# Patient Record
Sex: Male | Born: 1950
Health system: Southern US, Community
[De-identification: ages and names within clinical notes are randomized; demographics above are authoritative.]

## PROBLEM LIST (undated history)

## (undated) DIAGNOSIS — E785 Hyperlipidemia, unspecified: Secondary | ICD-10-CM

## (undated) DIAGNOSIS — I7121 Aneurysm of the ascending aorta, without rupture: Secondary | ICD-10-CM

## (undated) DIAGNOSIS — H269 Unspecified cataract: Secondary | ICD-10-CM

## (undated) DIAGNOSIS — C801 Malignant (primary) neoplasm, unspecified: Secondary | ICD-10-CM

## (undated) DIAGNOSIS — M1990 Unspecified osteoarthritis, unspecified site: Secondary | ICD-10-CM

## (undated) DIAGNOSIS — I712 Thoracic aortic aneurysm, without rupture: Secondary | ICD-10-CM

## (undated) DIAGNOSIS — I10 Essential (primary) hypertension: Secondary | ICD-10-CM

## (undated) DIAGNOSIS — G4730 Sleep apnea, unspecified: Secondary | ICD-10-CM

## (undated) DIAGNOSIS — I2510 Atherosclerotic heart disease of native coronary artery without angina pectoris: Secondary | ICD-10-CM

## (undated) DIAGNOSIS — Z87442 Personal history of urinary calculi: Secondary | ICD-10-CM

## (undated) DIAGNOSIS — R011 Cardiac murmur, unspecified: Secondary | ICD-10-CM

## (undated) DIAGNOSIS — T7840XA Allergy, unspecified, initial encounter: Secondary | ICD-10-CM

## (undated) HISTORY — PX: OTHER SURGICAL HISTORY: SHX169

## (undated) HISTORY — DX: Allergy, unspecified, initial encounter: T78.40XA

## (undated) HISTORY — PX: JOINT REPLACEMENT: SHX530

## (undated) HISTORY — PX: VASECTOMY: SHX75

## (undated) HISTORY — DX: Unspecified cataract: H26.9

## (undated) HISTORY — PX: KNEE ARTHROSCOPY: SUR90

## (undated) HISTORY — DX: Hyperlipidemia, unspecified: E78.5

## (undated) HISTORY — PX: APPENDECTOMY: SHX54

---

## 2004-04-06 ENCOUNTER — Encounter: Admission: RE | Admit: 2004-04-06 | Discharge: 2004-04-06 | Payer: Self-pay | Admitting: Internal Medicine

## 2005-10-19 ENCOUNTER — Inpatient Hospital Stay (HOSPITAL_COMMUNITY): Admission: RE | Admit: 2005-10-19 | Discharge: 2005-10-22 | Payer: Self-pay | Admitting: Specialist

## 2011-04-12 ENCOUNTER — Other Ambulatory Visit: Payer: Self-pay | Admitting: Pain Medicine

## 2011-06-18 ENCOUNTER — Inpatient Hospital Stay: Admit: 2011-06-18 | Payer: Self-pay | Admitting: Specialist

## 2011-06-18 SURGERY — ARTHROPLASTY, KNEE, TOTAL
Anesthesia: Spinal | Site: Knee | Laterality: Left

## 2011-10-14 ENCOUNTER — Other Ambulatory Visit: Payer: Self-pay | Admitting: Pain Medicine

## 2011-11-29 ENCOUNTER — Other Ambulatory Visit: Payer: Self-pay | Admitting: Pain Medicine

## 2011-11-29 NOTE — H&P (Signed)
TOTAL KNEE ADMISSION H&P  Patient is being admitted for left total knee arthroplasty.  Subjective:  Chief Complaint:left knee pain.  HPI: Nathan Collins, 61 y.o. male, has a history of pain and functional disability in the left knee due to arthritis and has failed non-surgical conservative treatments for greater than 12 weeks to includeNSAID's and/or analgesics, corticosteriod injections, flexibility and strengthening excercises, supervised PT with diminished ADL's post treatment and activity modification.  Onset of symptoms was gradual, starting 6 years ago with rapidlly worsening course since that time. The patient noted prior procedures on the knee to include  arthroscopy on the left knee(s).  Patient currently rates pain in the left knee(s) at 8 out of 10 with activity. Patient has night pain, worsening of pain with activity and weight bearing, pain that interferes with activities of daily living, pain with passive range of motion, crepitus and joint swelling.  Patient has evidence of subchondral cysts, subchondral sclerosis, periarticular osteophytes and joint space narrowing by imaging studies. This patient has had previousarthroscopy and conservative treatments that have not improved his pain. There is no active infection.  PMHx. HTN            Gout  No past medical history on file.  No past surgical history on file.   (Not in a hospital admission) Alleregies- INDOCIN (hives)  Social No tobacco, alcohol 2 a day   Family Hx-  Father D/C heart dz Mother D/c heart dz  Past surg hx Rt TKA, Left knee scope Appendectomy Review of Systems  Constitutional: Negative.   HENT: Negative.   Eyes: Negative.   Respiratory: Negative.   Cardiovascular: Negative.   Gastrointestinal: Negative.   Genitourinary: Negative.   Musculoskeletal: Positive for joint pain.       Left knee pain, difficulty with ADLs  Skin: Negative.   Neurological: Negative.   Endo/Heme/Allergies: Negative.     Psychiatric/Behavioral: Negative.     Objective:  Physical Exam Cons alert appropriate healthy appearing, amb with left sided limp, Head normal, neck full rom ,no lymphadenopathy, lungs clear, heart reg no murmurs, abd soft pos bowel sounds, upper extrem full rom no defects, right leg normal exam with well healed midline incision of knee with full extension and 120 degree flex, left 5-120 degree with varus deformity, calfs soft and non tender, good sense, breast, rectal and GU exams deferred.  Vital signs in last 24 hours: @VSRANGES @  Labs: pending   Vitals- 118/72,80,14. Wt-209, ht 5'7''  BMI 32.7   Imaging Review Plain radiographs demonstrate severe degenerative joint disease of the left knee(s). The overall alignment issignificant varus. The bone quality appears to be good for age and reported activity level.  Assessment/Plan: HTN Gout End stage arthritis, left knee   The patient history, physical examination, clinical judgment of the provider and imaging studies are consistent with end stage degenerative joint disease of the left knee(s) and total knee arthroplasty is deemed medically necessary. The treatment options including medical management, injection therapy arthroscopy and arthroplasty were discussed at length. The risks and benefits of total knee arthroplasty were presented and reviewed. The risks due to aseptic loosening, infection, stiffness, patella tracking problems, thromboembolic complications and other imponderables were discussed. The patient acknowledged the explanation, agreed to proceed with the plan and consent was signed. Patient is being admitted for inpatient treatment for surgery, pain control, PT, OT, prophylactic antibiotics, VTE prophylaxis, progressive ambulation and ADL's and discharge planning. The patient is planning to be discharged home with home health services

## 2011-12-01 ENCOUNTER — Encounter (HOSPITAL_COMMUNITY): Payer: Self-pay | Admitting: Pharmacy Technician

## 2011-12-06 NOTE — Patient Instructions (Signed)
19 Nathan Collins  12/06/2011   Your procedure is scheduled on:  12/10/11 1230pm-245pm  Report to [facility] at 1000 AM.  Call this number if you have problems the morning of surgery: [phone]   Remember:   Do not eat food:After Midnight.  May have clear liquids:until Midnight .  [name]  Take these medicines the morning of surgery with A SIP OF WATER:    Do not wear jewelry,   Do not wear lotions, powders, or perfumes.    Men may shave face and neck.  Do not bring valuables to the hospital.  Contacts, dentures or bridgework may not be worn into surgery.  Leave suitcase in the car. After surgery it may be brought to your room.  For patients admitted to the hospital, checkout time is 11:00 AM the day of discharge.                SEE CHG INSTRUCTION SHEET    Please read over the following fact sheets that you were given: MRSA Information, coughing and deep breathing exercises, leg exercises Incentive Spirometry Fact Sheet, Blood Transfusion Fact Sheet

## 2011-12-07 ENCOUNTER — Encounter (HOSPITAL_COMMUNITY)
Admission: RE | Admit: 2011-12-07 | Discharge: 2011-12-07 | Disposition: A | Discharge status: Home / Self Care | Payer: BC Managed Care – PPO | Source: Ambulatory Visit | Attending: Specialist | Admitting: Specialist

## 2011-12-07 ENCOUNTER — Ambulatory Visit (HOSPITAL_COMMUNITY)
Admission: RE | Admit: 2011-12-07 | Discharge: 2011-12-07 | Disposition: A | Discharge status: Home / Self Care | Payer: BC Managed Care – PPO | Source: Ambulatory Visit | Attending: Specialist | Admitting: Specialist

## 2011-12-07 ENCOUNTER — Encounter (HOSPITAL_COMMUNITY): Payer: Self-pay

## 2011-12-07 DIAGNOSIS — Z01812 Encounter for preprocedural laboratory examination: Secondary | ICD-10-CM | POA: Insufficient documentation

## 2011-12-07 DIAGNOSIS — M479 Spondylosis, unspecified: Secondary | ICD-10-CM | POA: Insufficient documentation

## 2011-12-07 DIAGNOSIS — I10 Essential (primary) hypertension: Secondary | ICD-10-CM | POA: Insufficient documentation

## 2011-12-07 DIAGNOSIS — Z01818 Encounter for other preprocedural examination: Secondary | ICD-10-CM | POA: Insufficient documentation

## 2011-12-07 DIAGNOSIS — Z01810 Encounter for preprocedural cardiovascular examination: Secondary | ICD-10-CM | POA: Insufficient documentation

## 2011-12-07 HISTORY — DX: Unspecified osteoarthritis, unspecified site: M19.90

## 2011-12-07 HISTORY — DX: Malignant (primary) neoplasm, unspecified: C80.1

## 2011-12-07 HISTORY — DX: Sleep apnea, unspecified: G47.30

## 2011-12-07 HISTORY — DX: Essential (primary) hypertension: I10

## 2011-12-07 LAB — COMPREHENSIVE METABOLIC PANEL
BUN: 24 mg/dL — ABNORMAL HIGH (ref 6–23)
Calcium: 9.1 mg/dL (ref 8.4–10.5)
Creatinine, Ser: 0.94 mg/dL (ref 0.50–1.35)
GFR calc Af Amer: >90 mL/min (ref >90)
GFR calc non Af Amer: 88 mL/min — ABNORMAL LOW (ref >90)
Glucose, Bld: 112 mg/dL — ABNORMAL HIGH (ref 70–99)
Sodium: 137 mEq/L (ref 135–145)
Total Protein: 6.8 g/dL (ref 6.0–8.3)

## 2011-12-07 LAB — CBC WITH DIFFERENTIAL/PLATELET
Eosinophils Absolute: 0.1 10*3/uL (ref 0.0–0.7)
Eosinophils Relative: 1 % (ref 0–5)
HCT: 44 % (ref 39.0–52.0)
Lymphs Abs: 1.3 10*3/uL (ref 0.7–4.0)
MCH: 30 pg (ref 26.0–34.0)
MCV: 87.3 fL (ref 78.0–100.0)
Monocytes Absolute: 0.4 10*3/uL (ref 0.1–1.0)
Monocytes Relative: 7 % (ref 3–12)
Platelets: 193 10*3/uL (ref 150–400)
RBC: 5.04 MIL/uL (ref 4.22–5.81)

## 2011-12-07 LAB — URINALYSIS, ROUTINE W REFLEX MICROSCOPIC
Ketones, ur: NEGATIVE mg/dL
Leukocytes, UA: NEGATIVE
Protein, ur: NEGATIVE mg/dL
Urobilinogen, UA: 0.2 mg/dL (ref 0.0–1.0)

## 2011-12-07 LAB — PROTIME-INR
INR: 0.97 (ref 0.00–1.49)
Prothrombin Time: 12.8 seconds (ref 11.6–15.2)

## 2011-12-07 LAB — SURGICAL PCR SCREEN: MRSA, PCR: NEGATIVE

## 2011-12-07 NOTE — Progress Notes (Signed)
Patient stated he will bring prescription drugs from home to take while at hospital due to cost.  Nurse instructed patient as a rule pharmacy will not dispense .  Patient stated he will bring prescription drugs from home in original bottle.

## 2011-12-07 NOTE — Progress Notes (Signed)
Sleep Study Report on chart from 4.29.2009

## 2011-12-07 NOTE — Progress Notes (Signed)
Patient aware to bring CPAP mask and tubing day of surgery.   

## 2011-12-10 ENCOUNTER — Encounter (HOSPITAL_COMMUNITY): Payer: Self-pay | Admitting: *Deleted

## 2011-12-10 ENCOUNTER — Inpatient Hospital Stay (HOSPITAL_COMMUNITY)
Admission: RE | Admit: 2011-12-10 | Discharge: 2011-12-12 | DRG: 209 | Disposition: A | Discharge status: Home Health | Payer: BC Managed Care – PPO | Source: Ambulatory Visit | Attending: Specialist | Admitting: Specialist

## 2011-12-10 ENCOUNTER — Encounter (HOSPITAL_COMMUNITY)
Admission: RE | Disposition: A | Discharge status: Home Health | Payer: Self-pay | Source: Ambulatory Visit | Attending: Specialist

## 2011-12-10 ENCOUNTER — Ambulatory Visit (HOSPITAL_COMMUNITY): Payer: BC Managed Care – PPO | Admitting: Anesthesiology

## 2011-12-10 ENCOUNTER — Encounter (HOSPITAL_COMMUNITY): Payer: Self-pay | Admitting: Anesthesiology

## 2011-12-10 DIAGNOSIS — Z96659 Presence of unspecified artificial knee joint: Secondary | ICD-10-CM

## 2011-12-10 DIAGNOSIS — I10 Essential (primary) hypertension: Secondary | ICD-10-CM | POA: Diagnosis present

## 2011-12-10 DIAGNOSIS — Z85820 Personal history of malignant melanoma of skin: Secondary | ICD-10-CM

## 2011-12-10 DIAGNOSIS — M1710 Unilateral primary osteoarthritis, unspecified knee: Principal | ICD-10-CM | POA: Diagnosis present

## 2011-12-10 DIAGNOSIS — G4730 Sleep apnea, unspecified: Secondary | ICD-10-CM | POA: Diagnosis present

## 2011-12-10 HISTORY — PX: TOTAL KNEE ARTHROPLASTY: SHX125

## 2011-12-10 SURGERY — ARTHROPLASTY, KNEE, TOTAL
Anesthesia: Spinal | Site: Knee | Laterality: Left | Wound class: Clean

## 2011-12-10 MED ORDER — MENTHOL 3 MG MT LOZG
1.0000 | LOZENGE | OROMUCOSAL | Status: DC | PRN
  Filled 2011-12-10: qty 9

## 2011-12-10 MED ORDER — ONDANSETRON HCL 4 MG PO TABS: 4.0000 mg | ORAL_TABLET | Freq: Four times a day (QID) | ORAL | Status: DC | PRN

## 2011-12-10 MED ORDER — LACTATED RINGERS IV SOLN
INTRAVENOUS | Status: DC | PRN
  Administered 2011-12-10 (×2): via INTRAVENOUS

## 2011-12-10 MED ORDER — CEFAZOLIN SODIUM-DEXTROSE 2-3 GM-% IV SOLR
2.0000 g | Freq: Four times a day (QID) | INTRAVENOUS | Status: AC
  Administered 2011-12-10 – 2011-12-11 (×2): 2 g via INTRAVENOUS
  Filled 2011-12-10 (×2): qty 50

## 2011-12-10 MED ORDER — CEFAZOLIN SODIUM-DEXTROSE 2-3 GM-% IV SOLR
INTRAVENOUS | Status: AC
Start: 2011-12-10 — End: 2011-12-10
  Filled 2011-12-10: qty 50

## 2011-12-10 MED ORDER — LOSARTAN POTASSIUM 50 MG PO TABS
100.0000 mg | ORAL_TABLET | Freq: Every morning | ORAL | Status: DC
  Administered 2011-12-11: 100 mg via ORAL
  Filled 2011-12-10 (×2): qty 2

## 2011-12-10 MED ORDER — PHENOL 1.4 % MT LIQD
1.0000 | OROMUCOSAL | Status: DC | PRN
  Filled 2011-12-10: qty 177

## 2011-12-10 MED ORDER — MIDAZOLAM HCL 5 MG/5ML IJ SOLN
INTRAMUSCULAR | Status: DC | PRN
  Administered 2011-12-10 (×2): 1 mg via INTRAVENOUS

## 2011-12-10 MED ORDER — ALUM & MAG HYDROXIDE-SIMETH 200-200-20 MG/5ML PO SUSP: 30.0000 mL | ORAL | Status: DC | PRN

## 2011-12-10 MED ORDER — FENTANYL CITRATE 0.05 MG/ML IJ SOLN
INTRAMUSCULAR | Status: DC | PRN
  Administered 2011-12-10 (×5): 50 ug via INTRAVENOUS

## 2011-12-10 MED ORDER — KETOROLAC TROMETHAMINE 30 MG/ML IJ SOLN
INTRAMUSCULAR | Status: AC
  Filled 2011-12-10: qty 1

## 2011-12-10 MED ORDER — ENOXAPARIN SODIUM 30 MG/0.3ML ~~LOC~~ SOLN
30.0000 mg | Freq: Two times a day (BID) | SUBCUTANEOUS | Status: DC
  Administered 2011-12-11 – 2011-12-12 (×3): 30 mg via SUBCUTANEOUS
  Filled 2011-12-10 (×6): qty 0.3

## 2011-12-10 MED ORDER — METHOCARBAMOL 100 MG/ML IJ SOLN
500.0000 mg | Freq: Four times a day (QID) | INTRAVENOUS | Status: DC | PRN
  Administered 2011-12-10: 500 mg via INTRAVENOUS
  Filled 2011-12-10 (×2): qty 5

## 2011-12-10 MED ORDER — DIPHENHYDRAMINE HCL 12.5 MG/5ML PO ELIX: 12.5000 mg | ORAL_SOLUTION | ORAL | Status: DC | PRN

## 2011-12-10 MED ORDER — PROMETHAZINE HCL 25 MG/ML IJ SOLN: 6.2500 mg | INTRAMUSCULAR | Status: DC | PRN

## 2011-12-10 MED ORDER — OXYCODONE HCL 5 MG PO TABS
5.0000 mg | ORAL_TABLET | ORAL | Status: DC | PRN
  Administered 2011-12-10 – 2011-12-12 (×12): 10 mg via ORAL
  Filled 2011-12-10 (×12): qty 2

## 2011-12-10 MED ORDER — LACTATED RINGERS IV SOLN: INTRAVENOUS | Status: DC

## 2011-12-10 MED ORDER — BISACODYL 10 MG RE SUPP: 10.0000 mg | Freq: Every day | RECTAL | Status: DC | PRN

## 2011-12-10 MED ORDER — ACETAMINOPHEN 325 MG PO TABS: 650.0000 mg | ORAL_TABLET | Freq: Four times a day (QID) | ORAL | Status: DC | PRN

## 2011-12-10 MED ORDER — METOCLOPRAMIDE HCL 10 MG PO TABS: 5.0000 mg | ORAL_TABLET | Freq: Three times a day (TID) | ORAL | Status: DC | PRN

## 2011-12-10 MED ORDER — SODIUM CHLORIDE 0.9 % IR SOLN
Status: DC | PRN
  Administered 2011-12-10: 1000 mL
  Administered 2011-12-10: 3000 mL

## 2011-12-10 MED ORDER — LIDOCAINE HCL (CARDIAC) 20 MG/ML IV SOLN
INTRAVENOUS | Status: DC | PRN
  Administered 2011-12-10: 50 mg via INTRAVENOUS

## 2011-12-10 MED ORDER — FERROUS SULFATE 325 (65 FE) MG PO TABS
325.0000 mg | ORAL_TABLET | Freq: Three times a day (TID) | ORAL | Status: DC
  Administered 2011-12-11 – 2011-12-12 (×4): 325 mg via ORAL
  Filled 2011-12-10 (×8): qty 1

## 2011-12-10 MED ORDER — ACETAMINOPHEN 650 MG RE SUPP: 650.0000 mg | Freq: Four times a day (QID) | RECTAL | Status: DC | PRN

## 2011-12-10 MED ORDER — SODIUM CHLORIDE 0.9 % IV SOLN: INTRAVENOUS | Status: DC

## 2011-12-10 MED ORDER — BUPIVACAINE-EPINEPHRINE 0.25% -1:200000 IJ SOLN
INTRAMUSCULAR | Status: AC
  Filled 2011-12-10: qty 1

## 2011-12-10 MED ORDER — ACETAMINOPHEN 10 MG/ML IV SOLN
INTRAVENOUS | Status: AC
  Filled 2011-12-10: qty 100

## 2011-12-10 MED ORDER — BUPIVACAINE IN DEXTROSE 0.75-8.25 % IT SOLN
INTRATHECAL | Status: DC | PRN
  Administered 2011-12-10: 2 mL via INTRATHECAL

## 2011-12-10 MED ORDER — HYDROMORPHONE HCL PF 1 MG/ML IJ SOLN
INTRAMUSCULAR | Status: AC
  Filled 2011-12-10: qty 1

## 2011-12-10 MED ORDER — BUPIVACAINE-EPINEPHRINE 0.25% -1:200000 IJ SOLN
INTRAMUSCULAR | Status: DC | PRN
  Administered 2011-12-10: 50 mL

## 2011-12-10 MED ORDER — PROPOFOL INFUSION 10 MG/ML OPTIME
INTRAVENOUS | Status: DC | PRN
  Administered 2011-12-10: 50 ug/kg/min via INTRAVENOUS

## 2011-12-10 MED ORDER — ACETAMINOPHEN 10 MG/ML IV SOLN
INTRAVENOUS | Status: DC | PRN
  Administered 2011-12-10: 1000 mg via INTRAVENOUS

## 2011-12-10 MED ORDER — DOCUSATE SODIUM 100 MG PO CAPS
100.0000 mg | ORAL_CAPSULE | Freq: Two times a day (BID) | ORAL | Status: DC
  Administered 2011-12-10 – 2011-12-11 (×3): 100 mg via ORAL

## 2011-12-10 MED ORDER — ALLOPURINOL 100 MG PO TABS
200.0000 mg | ORAL_TABLET | Freq: Every morning | ORAL | Status: DC
  Administered 2011-12-11: 200 mg via ORAL
  Filled 2011-12-10 (×2): qty 2

## 2011-12-10 MED ORDER — ACETAMINOPHEN 10 MG/ML IV SOLN
1000.0000 mg | Freq: Four times a day (QID) | INTRAVENOUS | Status: AC
  Administered 2011-12-10 – 2011-12-11 (×4): 1000 mg via INTRAVENOUS
  Filled 2011-12-10 (×5): qty 100

## 2011-12-10 MED ORDER — ZOLPIDEM TARTRATE 5 MG PO TABS: 5.0000 mg | ORAL_TABLET | Freq: Every evening | ORAL | Status: DC | PRN

## 2011-12-10 MED ORDER — METOCLOPRAMIDE HCL 5 MG/ML IJ SOLN: 5.0000 mg | Freq: Three times a day (TID) | INTRAMUSCULAR | Status: DC | PRN

## 2011-12-10 MED ORDER — POLYETHYLENE GLYCOL 3350 17 G PO PACK: 17.0000 g | PACK | Freq: Every day | ORAL | Status: DC | PRN

## 2011-12-10 MED ORDER — HYDROMORPHONE HCL PF 1 MG/ML IJ SOLN: 0.2500 mg | INTRAMUSCULAR | Status: DC | PRN

## 2011-12-10 MED ORDER — METHOCARBAMOL 500 MG PO TABS
500.0000 mg | ORAL_TABLET | Freq: Four times a day (QID) | ORAL | Status: DC | PRN
  Administered 2011-12-11 – 2011-12-12 (×4): 500 mg via ORAL
  Filled 2011-12-10 (×4): qty 1

## 2011-12-10 MED ORDER — POVIDONE-IODINE 7.5 % EX SOLN: Freq: Once | CUTANEOUS | Status: DC

## 2011-12-10 MED ORDER — CEFAZOLIN SODIUM-DEXTROSE 2-3 GM-% IV SOLR
2.0000 g | INTRAVENOUS | Status: AC
  Administered 2011-12-10: 2 g via INTRAVENOUS

## 2011-12-10 MED ORDER — POTASSIUM CHLORIDE IN NACL 20-0.9 MEQ/L-% IV SOLN
INTRAVENOUS | Status: DC
  Administered 2011-12-10: 21:00:00 via INTRAVENOUS
  Administered 2011-12-11: 1000 mL via INTRAVENOUS
  Filled 2011-12-10 (×4): qty 1000

## 2011-12-10 MED ORDER — ONDANSETRON HCL 4 MG/2ML IJ SOLN: 4.0000 mg | Freq: Four times a day (QID) | INTRAMUSCULAR | Status: DC | PRN

## 2011-12-10 MED ORDER — HYDROMORPHONE HCL PF 1 MG/ML IJ SOLN
0.5000 mg | INTRAMUSCULAR | Status: DC | PRN
  Administered 2011-12-10: 0.5 mg via INTRAVENOUS

## 2011-12-10 MED ORDER — FLEET ENEMA 7-19 GM/118ML RE ENEM: 1.0000 | ENEMA | Freq: Once | RECTAL | Status: AC | PRN

## 2011-12-10 SURGICAL SUPPLY — 70 items
BAG SPEC THK2 15X12 ZIP CLS (MISCELLANEOUS) ×2
BAG ZIPLOCK 12X15 (MISCELLANEOUS) ×4 IMPLANT
BANDAGE ELASTIC 4 VELCRO ST LF (GAUZE/BANDAGES/DRESSINGS) ×2 IMPLANT
BANDAGE ELASTIC 6 VELCRO ST LF (GAUZE/BANDAGES/DRESSINGS) ×2 IMPLANT
BANDAGE ESMARK 6X9 LF (GAUZE/BANDAGES/DRESSINGS) ×1 IMPLANT
BANDAGE GAUZE ELAST BULKY 4 IN (GAUZE/BANDAGES/DRESSINGS) ×3 IMPLANT
BLADE SAG 18X100X1.27 (BLADE) ×2 IMPLANT
BLADE SAW SGTL 13.0X1.19X90.0M (BLADE) ×2 IMPLANT
BNDG CMPR 9X6 STRL LF SNTH (GAUZE/BANDAGES/DRESSINGS) ×1
BNDG ESMARK 6X9 LF (GAUZE/BANDAGES/DRESSINGS) ×2
CEMENT HV SMART SET (Cement) ×4 IMPLANT
CLOTH BEACON ORANGE TIMEOUT ST (SAFETY) ×2 IMPLANT
CUFF TOURN SGL QUICK 34 (TOURNIQUET CUFF) ×2
CUFF TRNQT CYL 34X4X40X1 (TOURNIQUET CUFF) ×1 IMPLANT
DRAPE EXTREMITY T 121X128X90 (DRAPE) ×2 IMPLANT
DRAPE LG THREE QUARTER DISP (DRAPES) ×2 IMPLANT
DRAPE POUCH INSTRU U-SHP 10X18 (DRAPES) ×2 IMPLANT
DRAPE U-SHAPE 47X51 STRL (DRAPES) ×2 IMPLANT
DRSG PAD ABDOMINAL 8X10 ST (GAUZE/BANDAGES/DRESSINGS) ×3 IMPLANT
DURAPREP 26ML APPLICATOR (WOUND CARE) ×2 IMPLANT
ELECT REM PT RETURN 9FT ADLT (ELECTROSURGICAL) ×2
ELECTRODE REM PT RTRN 9FT ADLT (ELECTROSURGICAL) ×1 IMPLANT
EVACUATOR 1/8 PVC DRAIN (DRAIN) ×2 IMPLANT
FACESHIELD LNG OPTICON STERILE (SAFETY) ×10 IMPLANT
GAUZE XEROFORM 2X2 STRL (GAUZE/BANDAGES/DRESSINGS) ×2 IMPLANT
GLOVE BIOGEL PI IND STRL 6.5 (GLOVE) IMPLANT
GLOVE BIOGEL PI INDICATOR 6.5 (GLOVE) ×2
GLOVE ECLIPSE 6.5 STRL STRAW (GLOVE) ×2 IMPLANT
GLOVE ECLIPSE 8.0 STRL XLNG CF (GLOVE) ×2 IMPLANT
GLOVE SURG ORTHO 8.0 STRL STRW (GLOVE) ×2 IMPLANT
GLOVE SURG ORTHO 9.0 STRL STRW (GLOVE) ×2 IMPLANT
GLOVE SURG SS PI 7.5 STRL IVOR (GLOVE) ×3 IMPLANT
GOWN PREVENTION PLUS XLARGE (GOWN DISPOSABLE) ×4 IMPLANT
GOWN STRL NON-REIN LRG LVL3 (GOWN DISPOSABLE) ×3 IMPLANT
GOWN STRL REIN XL XLG (GOWN DISPOSABLE) ×4 IMPLANT
HANDPIECE INTERPULSE COAX TIP (DISPOSABLE) ×2
IMMOBILIZER KNEE 20 (SOFTGOODS) ×4
IMMOBILIZER KNEE 20 THIGH 36 (SOFTGOODS) IMPLANT
KIT BASIN OR (CUSTOM PROCEDURE TRAY) ×2 IMPLANT
NDL SAFETY ECLIPSE 18X1.5 (NEEDLE) IMPLANT
NEEDLE HYPO 18GX1.5 SHARP (NEEDLE) ×2
NS IRRIG 1000ML POUR BTL (IV SOLUTION) ×2 IMPLANT
PACK TOTAL JOINT (CUSTOM PROCEDURE TRAY) ×2 IMPLANT
POSITIONER SURGICAL ARM (MISCELLANEOUS) ×2 IMPLANT
SET HNDPC FAN SPRY TIP SCT (DISPOSABLE) ×1 IMPLANT
SET PAD KNEE POSITIONER (MISCELLANEOUS) ×2 IMPLANT
SPONGE GAUZE 4X4 12PLY (GAUZE/BANDAGES/DRESSINGS) ×2 IMPLANT
SPONGE LAP 18X18 X RAY DECT (DISPOSABLE) ×1 IMPLANT
SPONGE SURGIFOAM ABS GEL 100 (HEMOSTASIS) ×2 IMPLANT
STOCKINETTE 6  STRL (DRAPES) ×1
STOCKINETTE 6 STRL (DRAPES) ×1 IMPLANT
STRIP CLOSURE SKIN 1/2X4 (GAUZE/BANDAGES/DRESSINGS) ×3 IMPLANT
SUCTION FRAZIER 12FR DISP (SUCTIONS) ×2 IMPLANT
SUT BONE WAX W31G (SUTURE) ×2 IMPLANT
SUT MNCRL AB 3-0 PS2 18 (SUTURE) ×2 IMPLANT
SUT VIC AB 0 CT1 27 (SUTURE) ×2
SUT VIC AB 0 CT1 27XBRD ANTBC (SUTURE) ×2 IMPLANT
SUT VIC AB 1 CT1 27 (SUTURE) ×4
SUT VIC AB 1 CT1 27XBRD ANTBC (SUTURE) ×7 IMPLANT
SUT VIC AB 2-0 CT1 27 (SUTURE) ×4
SUT VIC AB 2-0 CT1 TAPERPNT 27 (SUTURE) ×2 IMPLANT
SUT VLOC 180 0 24IN GS25 (SUTURE) ×1 IMPLANT
SYR 50ML LL SCALE MARK (SYRINGE) ×1 IMPLANT
TAPE STRIPS DRAPE STRL (GAUZE/BANDAGES/DRESSINGS) ×2 IMPLANT
TOWEL OR 17X26 10 PK STRL BLUE (TOWEL DISPOSABLE) ×5 IMPLANT
TOWEL OR NON WOVEN STRL DISP B (DISPOSABLE) ×1 IMPLANT
TOWER CARTRIDGE SMART MIX (DISPOSABLE) ×2 IMPLANT
TRAY FOLEY CATH 14FRSI W/METER (CATHETERS) ×2 IMPLANT
WATER STERILE IRR 1500ML POUR (IV SOLUTION) ×2 IMPLANT
WRAP KNEE MAXI GEL POST OP (GAUZE/BANDAGES/DRESSINGS) ×4 IMPLANT

## 2011-12-10 NOTE — H&P (Signed)
TOTAL KNEE ADMISSION H&P  Patient is being admitted for left total knee arthroplasty.  Subjective:  Chief Complaint:left knee pain.  HPI: Nathan Collins, 61 y.o. male, has a history of pain and functional disability in the left knee due to arthritis and has failed non-surgical conservative treatments for greater than 12 weeks to includeNSAID's and/or analgesics, corticosteriod injections, flexibility and strengthening excercises, supervised PT with diminished ADL's post treatment and activity modification.  Onset of symptoms was gradual, starting 6 years ago with rapidlly worsening course since that time. The patient noted prior procedures on the knee to include  arthroscopy on the left knee(s).  Patient currently rates pain in the left knee(s) at 8 out of 10 with activity. Patient has night pain, worsening of pain with activity and weight bearing, pain that interferes with activities of daily living, pain with passive range of motion, crepitus and joint swelling.  Patient has evidence of subchondral cysts, subchondral sclerosis, periarticular osteophytes and joint space narrowing by imaging studies. This patient has had previousarthroscopy and conservative treatments that have not improved his pain. There is no active infection.  PMHx. HTN            Gout  Past Medical History  Diagnosis Date  . Hypertension   . Sleep apnea     CPAP- 4 GOES TO 12 OR 14   . Arthritis   . Cancer     HX OF MALIGNANT MELANOMA SHOULDER     Past Surgical History  Procedure Date  . Knee arthroscopy     LEFT   . Right knee surgery    . Joint replacement     RIGHT KNEE REPLACEMENT   . Appendectomy     Prescriptions prior to admission  Medication Sig Dispense Refill  . allopurinol (ZYLOPRIM) 100 MG tablet Take 200 mg by mouth every morning.      [name] ibuprofen (ADVIL,MOTRIN) 200 MG tablet Take 200 mg by mouth every 6 (six) hours as needed. For pain      . losartan (COZAAR) 100 MG tablet Take 100 mg by mouth  every morning.      [name] acetaminophen (TYLENOL) 500 MG tablet Take 500 mg by mouth every 6 (six) hours as needed. For pain       Alleregies- INDOCIN (hives)  Social No tobacco, alcohol 2 a day   Family Hx-  Father D/C heart dz Mother D/c heart dz  Past surg hx Rt TKA, Left knee scope Appendectomy Review of Systems  Constitutional: Negative.   HENT: Negative.   Eyes: Negative.   Respiratory: Negative.   Cardiovascular: Negative.   Gastrointestinal: Negative.   Genitourinary: Negative.   Musculoskeletal: Positive for joint pain.       Left knee pain, difficulty with ADLs  Skin: Negative.   Neurological: Negative.   Endo/Heme/Allergies: Negative.   Psychiatric/Behavioral: Negative.     Objective:  Physical Exam Cons alert appropriate healthy appearing, amb with left sided limp, Head normal, neck full rom ,no lymphadenopathy, lungs clear, heart reg no murmurs, abd soft pos bowel sounds, upper extrem full rom no defects, right leg normal exam with well healed midline incision of knee with full extension and 120 degree flex, left 5-120 degree with varus deformity, calfs soft and non tender, good sense, breast, rectal and GU exams deferred.  Vital signs in last 24 hours: @VSRANGES @  Labs: pending   Vitals- 118/72,80,14. Wt-209, ht 5'7''  BMI 32.7   Imaging Review Plain radiographs demonstrate severe degenerative joint disease  of the left knee(s). The overall alignment issignificant varus. The bone quality appears to be good for age and reported activity level.  Assessment/Plan: HTN Gout End stage arthritis, left knee   The patient history, physical examination, clinical judgment of the provider and imaging studies are consistent with end stage degenerative joint disease of the left knee(s) and total knee arthroplasty is deemed medically necessary. The treatment options including medical management, injection therapy arthroscopy and arthroplasty were discussed at length. The  risks and benefits of total knee arthroplasty were presented and reviewed. The risks due to aseptic loosening, infection, stiffness, patella tracking problems, thromboembolic complications and other imponderables were discussed. The patient acknowledged the explanation, agreed to proceed with the plan and consent was signed. Patient is being admitted for inpatient treatment for surgery, pain control, PT, OT, prophylactic antibiotics, VTE prophylaxis, progressive ambulation and ADL's and discharge planning. The patient is planning to be discharged home with home health services  I have seen and examined this patient.  Agree with the note above.  Ceri Mayer ANDREW 12/10/2011 1:12 PM

## 2011-12-10 NOTE — Anesthesia Procedure Notes (Signed)
Spinal  Patient location during procedure: OR Start time: 12/10/2011 1:18 PM End time: 12/10/2011 1:23 PM Staffing Anesthesiologist: Lucille Passy F Performed by: anesthesiologist  Preanesthetic Checklist Completed: patient identified, site marked, surgical consent, pre-op evaluation, timeout performed, IV checked, risks and benefits discussed and monitors and equipment checked Spinal Block Patient position: sitting Prep: Betadine Patient monitoring: heart rate, continuous pulse ox and blood pressure Injection technique: single-shot Needle Needle type: Quincke  Needle gauge: 22 G Needle length: 9 cm Additional Notes Expiration date of kit checked and confirmed. Patient tolerated procedure well, without complications. Negative heme/paresthesia Lot [id number] DOE 01/2013

## 2011-12-10 NOTE — Anesthesia Postprocedure Evaluation (Signed)
Anesthesia Post Note  Patient: Nathan Collins  Procedure(s) Performed: Procedure(s) (LRB): TOTAL KNEE ARTHROPLASTY (Left)  Anesthesia type: Spinal  Patient location: PACU  Post pain: Pain level controlled  Post assessment: Post-op Vital signs reviewed  Last Vitals:  Filed Vitals:   12/10/11 1616  BP: 121/83  Pulse:   Temp: 36.5 C  Resp:     Post vital signs: Reviewed  Level of consciousness: sedated  Complications: No apparent anesthesia complications

## 2011-12-10 NOTE — Transfer of Care (Signed)
Immediate Anesthesia Transfer of Care Note  Patient: Nathan Collins  Procedure(s) Performed: Procedure(s) (LRB) with comments: TOTAL KNEE ARTHROPLASTY (Left)  Patient Location: PACU  Anesthesia Type: MAC combined with regional for post-op pain  Level of Consciousness: awake, alert  and oriented  Airway & Oxygen Therapy: Patient Spontanous Breathing and Patient connected to face mask oxygen  Post-op Assessment: Report given to PACU RN and Post -op Vital signs reviewed and stable  Post vital signs: Reviewed and stable  Complications: No apparent anesthesia complications

## 2011-12-10 NOTE — Op Note (Signed)
DATE OF SURGERY:  12/10/2011  TIME: 3:13 PM  PATIENT NAME:  Nathan Collins    AGE: 61 y.o.   PRE-OPERATIVE DIAGNOSIS:  Osteoarthritis of the left knee  POST-OPERATIVE DIAGNOSIS:  Osteoarthritis of the left knee  PROCEDURE:  Procedure(s): TOTAL KNEE ARTHROPLASTY  SURGEON:  Martrice Apt ANDREW  ASSISTANT:  Oneida Alar, PA-C, present and scrubbed throughout the case, critical for assistance with exposure, retraction, instrumentation, and closure.  OPERATIVE IMPLANTS: Depuy PFC Sigma Rotating Platform.  Femur size 4, Tibia size 5, Patella size 38 3-peg oval button, with a 12.5 mm polyethylene insert.   PREOPERATIVE INDICATIONS:   Nathan Collins is a 61 y.o. year old male with end stage bone on bone arthritis of the knee who failed conservative treatment and elected for Total Knee Arthroplasty.   The risks, benefits, and alternatives were discussed at length including but not limited to the risks of infection, bleeding, nerve injury, stiffness, blood clots, the need for revision surgery, cardiopulmonary complications, among others, and they were willing to proceed.  OPERATIVE DESCRIPTION:  The patient was brought to the operative room and placed in a supine position.  Spinal anesthesia was administered.  IV antibiotics were given.  The lower extremity was prepped and draped in the usual sterile fashion.  Time out was performed.  The leg was elevated and exsanguinated and the tourniquet was inflated.  Anterior quadriceps tendon splitting approach was performed.  The patella was retracted and osteophytes were removed.  The anterior horn of the medial and lateral meniscus was removed and cruciate ligaments resected.   The distal femur was opened with the drill and the intramedullary distal femoral cutting jig was utilized, set at 5 degrees resecting 10 mm off the distal femur.  Care was taken to protect the collateral ligaments.  The distal femoral sizing jig was applied, taking care  to avoid notching.  Then the 4-in-1 cutting jig was applied and the anterior and posterior femur was cut, along with the chamfer cuts.    Then the extramedullary tibial cutting jig was utilized making the appropriate cut using the anterior tibial crest as a reference building in appropriate posterior slope.  Care was taken during the cut to protect the medial and collateral ligaments.  The proximal tibia was removed along with the posterior horns of the menisci.   The posterior medial femoral osteophytes and posterior lateral femoral osteophytes were removed.    The flexion gap was then measured and was symmetric with the extension gap, measured at 12.  I completed the distal femoral preparation using the appropriate jig to prepare the box.  The patella was then measured, and cut with the saw.    The proximal tibia sized and prepared accordingly with the reamer and the punch, and then all components were trialed with the trial insert.  The knee was found to have excellent balance and full motion.    The above named components were then cemented into place and all excess cement was removed.  The trial polyethylene component was in place during cementation, and then was exchanged for the real polyethylene component.    The knee was easily taken through a range of motion and the patella tracked well and the knee irrigated copiously and the parapatellar and subcutaneous tissue closed with vicryl, and monocryl with steri strips for the skin.  The arthrotomy was closed at 90 of flexion. The wounds were dressed with sterile gauze and the tourniquet released and the patient was awakened and returned  to the PACU in stable and satisfactory condition.  There were no complications.  Total tourniquet time was 100 minutes.

## 2011-12-10 NOTE — Anesthesia Preprocedure Evaluation (Addendum)
Anesthesia Evaluation  Patient identified by MRN, date of birth, ID band Patient awake    Reviewed: Allergy & Precautions, H&P , NPO status , Patient's Chart, lab work & pertinent test results  Airway Mallampati: II TM Distance: >3 FB Neck ROM: Full    Dental  (+) Teeth Intact and Dental Advisory Given   Pulmonary sleep apnea and Continuous Positive Airway Pressure Ventilation ,  breath sounds clear to auscultation  Pulmonary exam normal       Cardiovascular hypertension, Pt. on medications Rhythm:Regular Rate:Normal     Neuro/Psych negative neurological ROS  negative psych ROS   GI/Hepatic negative GI ROS, Neg liver ROS,   Endo/Other  negative endocrine ROS  Renal/GU negative Renal ROS  negative genitourinary   Musculoskeletal  (+) Arthritis -,   Abdominal   Peds  Hematology negative hematology ROS (+)   Anesthesia Other Findings   Reproductive/Obstetrics negative OB ROS                          Anesthesia Physical Anesthesia Plan  ASA: II  Anesthesia Plan: Spinal   Post-op Pain Management:    Induction:   Airway Management Planned:   Additional Equipment:   Intra-op Plan:   Post-operative Plan:   Informed Consent: I have reviewed the patients History and Physical, chart, labs and discussed the procedure including the risks, benefits and alternatives for the proposed anesthesia with the patient or authorized representative who has indicated his/her understanding and acceptance.   Dental advisory given  Plan Discussed with: CRNA  Anesthesia Plan Comments:         Anesthesia Quick Evaluation

## 2011-12-11 LAB — BASIC METABOLIC PANEL
Chloride: 99 mEq/L (ref 96–112)
GFR calc Af Amer: >90 mL/min (ref >90)
GFR calc non Af Amer: 88 mL/min — ABNORMAL LOW (ref >90)
Potassium: 4.3 mEq/L (ref 3.5–5.1)
Sodium: 136 mEq/L (ref 135–145)

## 2011-12-11 LAB — CBC
HCT: 37.1 % — ABNORMAL LOW (ref 39.0–52.0)
Hemoglobin: 12.8 g/dL — ABNORMAL LOW (ref 13.0–17.0)
MCHC: 34.5 g/dL (ref 30.0–36.0)
RDW: 12.8 % (ref 11.5–15.5)
WBC: 7.9 10*3/uL (ref 4.0–10.5)

## 2011-12-11 NOTE — Evaluation (Signed)
Physical Therapy Evaluation Patient Details Name: MARSHAWN NORMOYLE MRN: [id number] DOB: 06/09/1950 Today's Date: 12/11/2011 Time: 4098-1191 PT Time Calculation (min): 29 min  PT Assessment / Plan / Recommendation Clinical Impression  61 y.o. male POD #1 with L TKA. Pt doing very well with mobility, he ambulated 200' with RW and supervision. Expect excellent progress. HHPT and RW recommended for home. Pt would benefit from acute PT to maximize safety and independence with mobility.     PT Assessment  Patient needs continued PT services    Follow Up Recommendations  Home health PT    Does the patient have the potential to tolerate intense rehabilitation      Barriers to Discharge None      Equipment Recommendations  Rolling walker with 5" wheels    Recommendations for Other Services OT consult   Frequency 7X/week    Precautions / Restrictions Precautions Precautions: Knee Required Braces or Orthoses: Knee Immobilizer - Left Knee Immobilizer - Left: On except when in CPM Restrictions Weight Bearing Restrictions: No   Pertinent Vitals/Pain *5/10 L knee Premedicated, ice applied**      Mobility  Bed Mobility Bed Mobility: Supine to Sit Supine to Sit: 4: Min assist;HOB elevated;With rails Details for Bed Mobility Assistance: some A needed to guide LLE off bed. Transfers Sit to Stand: 4: Min guard;With upper extremity assist;From chair/3-in-1;From bed Stand to Sit: 4: Min guard;With upper extremity assist;With armrests;To chair/3-in-1 Details for Transfer Assistance: min cues for hand placement and LLE management. Ambulation/Gait Ambulation/Gait Assistance: 5: Supervision Ambulation Distance (Feet): 200 Feet Assistive device: Rolling walker Ambulation/Gait Assistance Details: Knee immobilizer on LLE Gait Pattern: Step-to pattern General Gait Details: good sequencing, no LOB, good posture    Shoulder Instructions     Exercises Total Joint Exercises Ankle  Circles/Pumps: AROM;Both;15 reps;Supine Quad Sets: AROM;Left;5 reps;Supine Heel Slides: AAROM;Left;10 reps;Supine   PT Diagnosis: Difficulty walking;Acute pain  PT Problem List: Pain;Decreased mobility;Decreased range of motion PT Treatment Interventions: Functional mobility training;Gait training;Stair training;Therapeutic exercise;Patient/family education   PT Goals Acute Rehab PT Goals PT Goal Formulation: With patient Time For Goal Achievement: 12/14/11 Potential to Achieve Goals: Good Pt will go Supine/Side to Sit: Independently PT Goal: Supine/Side to Sit - Progress: Goal set today Pt will go Sit to Stand: with modified independence PT Goal: Sit to Stand - Progress: Goal set today Pt will Ambulate: >150 feet;with modified independence PT Goal: Ambulate - Progress: Goal set today Pt will Go Up / Down Stairs: 3-5 stairs;with rolling walker;with min assist PT Goal: Up/Down Stairs - Progress: Goal set today Pt will Perform Home Exercise Program: Independently PT Goal: Perform Home Exercise Program - Progress: Goal set today  Visit Information  Assistance Needed: +1    Subjective Data  Subjective: I'm motivated.  Patient Stated Goal: return to prior level of independence   Prior Functioning  Home Living Lives With: Spouse Available Help at Discharge: Family Type of Home: House Home Access: Stairs to enter [employer] of Steps: 3 Entrance Stairs-Rails: None Home Layout: Two level;Able to live on main level with bedroom/bathroom Bathroom Shower/Tub: Tub/shower unit Bathroom Toilet: Handicapped height Home Adaptive Equipment: Shower chair without back;Shower chair with back;Walker - standard;Straight cane;Raised toilet seat with rails Prior Function Level of Independence: Independent Able to Take Stairs?: Yes Driving: Yes Vocation: Full time employment Communication Communication: No difficulties Dominant Hand: Right    Cognition  Overall Cognitive  Status: Appears within functional limits for tasks assessed/performed Arousal/Alertness: Awake/alert Orientation Level: Appears intact for tasks  assessed Behavior During Session: [facility] for tasks performed    Extremity/Trunk Assessment Right Upper Extremity Assessment RUE ROM/Strength/Tone: [facility] for tasks assessed Left Upper Extremity Assessment LUE ROM/Strength/Tone: WFL for tasks assessed Right Lower Extremity Assessment RLE ROM/Strength/Tone: Within functional levels RLE Sensation: WFL - Light Touch RLE Coordination: WFL - gross/fine motor Left Lower Extremity Assessment LLE ROM/Strength/Tone: Deficits;Due to pain LLE ROM/Strength/Tone Deficits: knee flexion AAROM approx 30*, limited by pain LLE Sensation: WFL - Light Touch LLE Coordination: WFL - gross/fine motor Trunk Assessment Trunk Assessment: Normal   Balance Balance Balance Assessed: Yes Static Sitting Balance Static Sitting - Balance Support: Feet supported;No upper extremity supported Static Sitting - Level of Assistance: 7: Independent Static Sitting - Comment/# of Minutes: 2  End of Session PT - End of Session Equipment Utilized During Treatment: Left knee immobilizer Activity Tolerance: Patient tolerated treatment well Patient left: in chair;with call bell/phone within reach;with family/visitor present Nurse Communication: Mobility status  GP     [name] 12/11/2011, 10:08 AM  [phone]

## 2011-12-11 NOTE — Progress Notes (Signed)
Physical Therapy Treatment Patient Details Name: Nathan Collins MRN: [id number] DOB: 07/16/1950 Today's Date: 12/11/2011 Time: 3086-5784 PT Time Calculation (min): 29 min  PT Assessment / Plan / Recommendation Comments on Treatment Session  Excellent progress with mobility, he ambulated 200' with RW.  Also doing well with ther ex. Will plan to do stair training tomorrow.     Follow Up Recommendations  Home health PT     Does the patient have the potential to tolerate intense rehabilitation     Barriers to Discharge None      Equipment Recommendations  Rolling walker with 5" wheels    Recommendations for Other Services OT consult  Frequency 7X/week   Plan Discharge plan remains appropriate;Frequency remains appropriate    Precautions / Restrictions Precautions Precautions: Knee Required Braces or Orthoses: Knee Immobilizer - Left Knee Immobilizer - Left: On except when in CPM Restrictions Weight Bearing Restrictions: No   Pertinent Vitals/Pain **6/10 L knee Premedicated, ice applied*    Mobility  Bed Mobility Bed Mobility: Supine to Sit Supine to Sit: 4: Min assist;HOB elevated;With rails Details for Bed Mobility Assistance: assist for LLE (pt 90%), pt used RLE to support LLE Transfers Sit to Stand: With upper extremity assist;From bed;5: Supervision Stand to Sit: With upper extremity assist;With armrests;To chair/3-in-1;5: Supervision Details for Transfer Assistance: min cues for hand placement and LLE management. Ambulation/Gait Ambulation/Gait Assistance: 5: Supervision Ambulation Distance (Feet): 200 Feet Assistive device: Rolling walker Ambulation/Gait Assistance Details: Knee immobilizer on LLE Gait Pattern: Step-to pattern General Gait Details: good sequencing, no LOB, good posture    Exercises Total Joint Exercises Ankle Circles/Pumps: AROM;Both;15 reps;Supine Quad Sets: AROM;Left;5 reps;Supine Towel Squeeze: AROM;Both;10 reps Short Arc QuadBarbaraann Boys;Left;10 reps Heel Slides: AAROM;Left;10 reps;Supine Hip ABduction/ADduction: AAROM;Left;10 reps Straight Leg Raises: AAROM;Left;10 reps   PT Diagnosis: Difficulty walking;Acute pain  PT Problem List: Pain;Decreased mobility;Decreased range of motion PT Treatment Interventions: Functional mobility training;Gait training;Stair training;Therapeutic exercise;Patient/family education   PT Goals Acute Rehab PT Goals PT Goal Formulation: With patient Time For Goal Achievement: 12/14/11 Potential to Achieve Goals: Good Pt will go Supine/Side to Sit: Independently PT Goal: Supine/Side to Sit - Progress: Progressing toward goal Pt will go Sit to Stand: with modified independence PT Goal: Sit to Stand - Progress: Progressing toward goal Pt will Ambulate: >150 feet;with modified independence;with rolling walker PT Goal: Ambulate - Progress: Progressing toward goal Pt will Go Up / Down Stairs: 3-5 stairs;with rolling walker;with min assist PT Goal: Up/Down Stairs - Progress: Goal set today Pt will Perform Home Exercise Program: Independently PT Goal: Perform Home Exercise Program - Progress: Progressing toward goal  Visit Information  Last PT Received On: 12/11/11 Assistance Needed: +1    Subjective Data  Subjective: I'm tired.  Patient Stated Goal: return to prior level of independence   Cognition  Overall Cognitive Status: Appears within functional limits for tasks assessed/performed Arousal/Alertness: Awake/alert Orientation Level: Appears intact for tasks assessed Behavior During Session: [facility] for tasks performed    Balance  Balance Balance Assessed: Yes Static Sitting Balance Static Sitting - Balance Support: Feet supported;No upper extremity supported Static Sitting - Level of Assistance: 7: Independent Static Sitting - Comment/# of Minutes: 2  End of Session PT - End of Session Equipment Utilized During Treatment: Left knee immobilizer Activity Tolerance: Patient  tolerated treatment well Patient left: in chair;with call bell/phone within reach;with family/visitor present Nurse Communication: Mobility status   GP     Nathan Collins 12/11/2011, 12:57 PM [phone]

## 2011-12-11 NOTE — Care Management Note (Signed)
    Page 1 of 2   12/12/2011     11:09:24 AM   CARE MANAGEMENT NOTE 12/12/2011  Patient:  Nathan Collins, Nathan Collins   Account Number:  [id number]  Date Initiated:  12/11/2011  Documentation initiated by:  [facility]  Subjective/Objective Assessment:   post op surgery.     Action/Plan:   Home with hh services which have been pre-arranged with [facility].  Caregiver is spouse. Pt states he already has DME from previous surgery   Anticipated DC Date:  12/13/2011   Anticipated DC Plan:  HOME W HOME HEALTH SERVICES  In-house referral  NA      DC Planning Services  CM consult      [facility] Choice  HOME HEALTH   Choice offered to / List presented to:  C-1 Patient   DME arranged  NA      DME agency  NA     HH arranged  HH-2 PT      [facility] agency  [facility]   Status of service:  Completed, signed off Medicare Important Message given?  NO (If response is "NO", the following Medicare IM given date fields will be blank) Date Medicare IM given:   Date Additional Medicare IM given:    Discharge Disposition:  HOME W HOME HEALTH SERVICES  Per UR Regulation:  Reviewed for med. necessity/level of care/duration of stay  If discussed at Long Length of Stay Meetings, dates discussed:    Comments:  12/12/2011 Raynelle Bring BSN CCM [phone] Pt discharged today with [facility] services in place. Services will start tomorrow 12/13/2011. Contact info for [facility] given to patient .  12-11-11 11:10am Avie Arenas, RNBSN [phone] Spoke with patient and wife in room.  Prior to surgery at home with wife and independent.  Prior to surgery set up with [facility].  CM will follow for further needs on discharge.

## 2011-12-11 NOTE — Evaluation (Signed)
Occupational Therapy Evaluation Patient Details Name: Nathan Collins MRN: [id number] DOB: 08-Sep-1950 Today's Date: 12/11/2011 Time: 4098-1191 OT Time Calculation (min): 31 min  OT Assessment / Plan / Recommendation Clinical Impression  Pt doing well POD 1 LTKR. Skilled OT recommended to maximize independence with BADLs to supervision level in prep for d/c home.    OT Assessment  Patient needs continued OT Services    Follow Up Recommendations  No OT follow up    Barriers to Discharge      Equipment Recommendations  None recommended by OT    Recommendations for Other Services    Frequency  Min 2X/week    Precautions / Restrictions Precautions Precautions: Knee Required Braces or Orthoses: Knee Immobilizer - Left Knee Immobilizer - Left: On except when in CPM Restrictions Weight Bearing Restrictions: No   Pertinent Vitals/Pain Reported 5/10 pain in L knee when ambulating. Repositioned and cold applied.    ADL  Grooming: Performed;Min guard Where Assessed - Grooming: Supported standing Toilet Transfer: Performed;Min Pension scheme manager Method: Sit to Barista: Raised toilet seat with arms (or 3-in-1 over toilet) Equipment Used: Rolling walker ADL Comments: Pt mobilizing well. Expressed concern about being able to get in and out of tub. Will address next session.    OT Diagnosis: Generalized weakness  OT Problem List: Decreased knowledge of use of DME or AE;Pain;Decreased activity tolerance OT Treatment Interventions: Self-care/ADL training;Therapeutic activities;Patient/family education;DME and/or AE instruction   OT Goals Acute Rehab OT Goals OT Goal Formulation: With patient/family Time For Goal Achievement: 12/18/11 Potential to Achieve Goals: Good ADL Goals Pt Will Perform Grooming: with supervision;Standing at sink ADL Goal: Grooming - Progress: Goal set today Pt Will Perform Lower Body Bathing: with supervision;Sit to stand from  chair;Sit to stand from bed ADL Goal: Lower Body Bathing - Progress: Goal set today Pt Will Perform Lower Body Dressing: with supervision;Sit to stand from chair;Sit to stand from bed ADL Goal: Lower Body Dressing - Progress: Goal set today Pt Will Transfer to Toilet: with supervision;Ambulation;Raised toilet seat with arms ADL Goal: Toilet Transfer - Progress: Goal set today Pt Will Perform Toileting - Clothing Manipulation: with supervision;Sitting on 3-in-1 or toilet;Standing ADL Goal: Toileting - Clothing Manipulation - Progress: Goal set today Pt Will Perform Toileting - Hygiene: with supervision;Sit to stand from 3-in-1/toilet ADL Goal: Toileting - Hygiene - Progress: Goal set today Pt Will Perform Tub/Shower Transfer: Tub transfer;with supervision;Ambulation ADL Goal: Tub/Shower Transfer - Progress: Goal set today  Visit Information  Last OT Received On: 12/11/11 Assistance Needed: +1 PT/OT Co-Evaluation/Treatment: Yes    Subjective Data  Subjective: I'd like to go back to work Patient Stated Goal: Walk   Prior Functioning     Home Living Lives With: Spouse Available Help at Discharge: Family Type of Home: House Home Access: Stairs to enter Secretary/administrator of Steps: 3 Entrance Stairs-Rails: None Home Layout: Two level;Able to live on main level with bedroom/bathroom Bathroom Shower/Tub: Tub/shower unit Bathroom Toilet: Handicapped height Home Adaptive Equipment: Shower chair without back;Shower chair with back;Walker - standard;Straight cane;Raised toilet seat with rails Prior Function Level of Independence: Independent Able to Take Stairs?: Yes Driving: Yes Vocation: Full time employment Communication Communication: No difficulties Dominant Hand: Right         Vision/Perception     Cognition  Overall Cognitive Status: Appears within functional limits for tasks assessed/performed Arousal/Alertness: Awake/alert Orientation Level: Appears intact for  tasks assessed Behavior During Session: [facility] for tasks performed    Extremity/Trunk  Assessment Right Upper Extremity Assessment RUE ROM/Strength/Tone: [facility] for tasks assessed Left Upper Extremity Assessment LUE ROM/Strength/Tone: WFL for tasks assessed     Mobility Bed Mobility Bed Mobility: Supine to Sit Supine to Sit: 4: Min assist;HOB elevated;With rails Details for Bed Mobility Assistance: some A needed to guide LLE off bed. Transfers Transfers: Sit to Stand;Stand to Sit Sit to Stand: 4: Min guard;With upper extremity assist;From chair/3-in-1;From bed Stand to Sit: 4: Min guard;With upper extremity assist;With armrests;To chair/3-in-1 Details for Transfer Assistance: min cues for hand placement and LLE management.     Shoulder Instructions     Exercise     Balance     End of Session OT - End of Session Activity Tolerance: Patient tolerated treatment well Patient left: in chair;with call bell/phone within reach;with family/visitor present  GO     Conor Lata A OTR/L [phone] 12/11/2011, 10:00 AM

## 2011-12-11 NOTE — Progress Notes (Signed)
Nathan Collins  MRN: [id number] DOB/Age: 1951/01/08 61 y.o. Physician: Lynnea Maizes, M.D. 1 Day Post-Op Procedure(s) (LRB): TOTAL KNEE ARTHROPLASTY (Left)  Subjective: Mild to moderate pain, controlled with PO's last night Vital Signs Temp:  [97 F (36.1 C)-98.7 F (37.1 C)] 98.7 F (37.1 C) (10/19 0554) Pulse Rate:  [38-79] 74  (10/19 0554) Resp:  [8-20] 14  (10/19 0554) BP: (113-167)/(58-91) 130/77 mmHg (10/19 0554) SpO2:  [96 %-100 %] 98 % (10/19 0554) FiO2 (%):  [97 %] 97 % (10/18 1630) Weight:  [95.709 kg (211 lb)] 95.709 kg (211 lb) (10/18 1630)  Lab Results  [facility] 12/11/11 0438  WBC 7.9  HGB 12.8*  HCT 37.1*  PLT 191   BMET  Basename 12/11/11 0438  NA 136  K 4.3  CL 99  CO2 26  GLUCOSE 132*  BUN 16  CREATININE 0.94  CALCIUM 8.5   INR  Date Value Range Status  12/07/2011 0.97  0.00 - 1.49 Final     Exam  N/V intact, hemovac D/C'd, dressings dry  Plan Progress per TKA protocol [name] M 12/11/2011, 8:47 AM

## 2011-12-12 DIAGNOSIS — Z96659 Presence of unspecified artificial knee joint: Secondary | ICD-10-CM

## 2011-12-12 LAB — CBC
HCT: 36.8 % — ABNORMAL LOW (ref 39.0–52.0)
Hemoglobin: 12.7 g/dL — ABNORMAL LOW (ref 13.0–17.0)
RBC: 4.18 MIL/uL — ABNORMAL LOW (ref 4.22–5.81)

## 2011-12-12 MED ORDER — DSS 100 MG PO CAPS: 100.0000 mg | ORAL_CAPSULE | Freq: Two times a day (BID) | ORAL | Status: DC

## 2011-12-12 MED ORDER — POLYETHYLENE GLYCOL 3350 17 G PO PACK: 17.0000 g | PACK | Freq: Two times a day (BID) | ORAL | Status: DC

## 2011-12-12 MED ORDER — ASPIRIN EC 325 MG PO TBEC: 325.0000 mg | DELAYED_RELEASE_TABLET | Freq: Two times a day (BID) | ORAL | Status: DC

## 2011-12-12 MED ORDER — METHOCARBAMOL 500 MG PO TABS: 500.0000 mg | ORAL_TABLET | Freq: Four times a day (QID) | ORAL | Status: DC | PRN

## 2011-12-12 MED ORDER — OXYCODONE HCL 5 MG PO TABS: 5.0000 mg | ORAL_TABLET | ORAL | Status: DC | PRN

## 2011-12-12 MED ORDER — FERROUS SULFATE 325 (65 FE) MG PO TABS: 325.0000 mg | ORAL_TABLET | Freq: Three times a day (TID) | ORAL | Status: DC

## 2011-12-12 NOTE — Progress Notes (Signed)
Physical Therapy Treatment Patient Details Name: Nathan Collins MRN: [id number] DOB: 03-26-1950 Today's Date: 12/12/2011 Time: 0850-0907 PT Time Calculation (min): 17 min  PT Assessment / Plan / Recommendation Comments on Treatment Session  Stair training completed. Pt ready to DC home. HHPT recommended.     Follow Up Recommendations  Home health PT     Does the patient have the potential to tolerate intense rehabilitation     Barriers to Discharge        Equipment Recommendations  Rolling walker with 5" wheels    Recommendations for Other Services    Frequency 7X/week   Plan Discharge plan remains appropriate;Frequency remains appropriate    Precautions / Restrictions Precautions Precautions: Knee Required Braces or Orthoses: Knee Immobilizer - Left Knee Immobilizer - Left: On except when in CPM Restrictions Weight Bearing Restrictions: No   Pertinent Vitals/Pain *5-6/10 L knee, ice applied HR 108, RN aware**    Mobility  Bed Mobility Bed Mobility: Not assessed  Transfers: Sit to Stand: With upper extremity assist;6: Modified independent (Device/Increase time);From chair/3-in-1 Stand to Sit: With upper extremity assist;With armrests;To chair/3-in-1;6: Modified independent (Device/Increase time) Ambulation/Gait Ambulation/Gait Assistance: 5: Supervision;6: Modified independent (Device/Increase time) Ambulation Distance (Feet): 35 Feet Assistive device: Rolling walker Gait Pattern: Step-to pattern General Gait Details: good sequencing, no LOB, good posture Stairs: Yes Stairs Assistance: 4: Min assist Stairs Assistance Details (indicate cue type and reason): min A to steady RW, wife assisted Stair Management Technique: No rails;Backwards;Step to pattern;With walker Number of Stairs: 3     Exercises   Straight Leg Raises: AAROM;Left;5 reps Long Arc Quad: AAROM;Left;10 reps;Seated Knee Flexion: AAROM;Left;10 reps;Seated   PT Diagnosis:    PT Problem List:    PT Treatment Interventions:     PT Goals Acute Rehab PT Goals PT Goal Formulation: With patient Time For Goal Achievement: 12/14/11 Potential to Achieve Goals: Good Pt will go Supine/Side to Sit: Independently PT Goal: Supine/Side to Sit - Progress: Met Pt will go Sit to Stand: with modified independence PT Goal: Sit to Stand - Progress: Met Pt will Ambulate: >150 feet;with modified independence;with rolling walker PT Goal: Ambulate - Progress: Met Pt will Go Up / Down Stairs: 3-5 stairs;with rolling walker;with min assist PT Goal: Up/Down Stairs - Progress: Met Pt will Perform Home Exercise Program: Independently PT Goal: Perform Home Exercise Program - Progress: Progressing toward goal  Visit Information  Last PT Received On: 12/12/11 Assistance Needed: +1    Subjective Data  Subjective: I didn't sleep well last night.  Patient Stated Goal: progress with mobility   Cognition  Overall Cognitive Status: Appears within functional limits for tasks assessed/performed Arousal/Alertness: Awake/alert Orientation Level: Appears intact for tasks assessed Behavior During Session: [facility] for tasks performed    Balance     End of Session PT - End of Session Equipment Utilized During Treatment: Left knee immobilizer Activity Tolerance: Patient tolerated treatment well Patient left: in chair;with call bell/phone within reach;with family/visitor present Nurse Communication: Mobility status   GP     [name] 12/12/2011, 10:13 AM [phone]

## 2011-12-12 NOTE — Progress Notes (Signed)
   Subjective: 2 Days Post-Op Procedure(s) (LRB): TOTAL KNEE ARTHROPLASTY (Left)   Patient reports pain as mild, pain well controlled. Doing well it PT. No events throughout the night. Ready for discharge home.  Objective:   VITALS:   Filed Vitals:   12/12/11 0629  BP: 137/78  Pulse: 95  Temp: 99.4 F (37.4 C)   Resp: 16    Neurovascular intact Dorsiflexion/Plantar flexion intact Incision: dressing C/D/I No cellulitis present Compartment soft  LABS  Basename 12/12/11 0506 12/11/11 0438  HGB 12.7* 12.8*  HCT 36.8* 37.1*  WBC 10.0 7.9  PLT 175 191     Basename 12/11/11 0438  NA 136  K 4.3  BUN 16  CREATININE 0.94  GLUCOSE 132*     Assessment/Plan: 2 Days Post-Op Procedure(s) (LRB): TOTAL KNEE ARTHROPLASTY (Left) ACE bandage removed and dressing changed Up with therapy Discharge home with home health Follow up in 2 weeks at [facility]. Follow-up Information    Follow up with OLIN,Sharmila Wrobleski D in 2 weeks.   Contact information:   [facility] [address], [address] [place] [address] [phone]             Anastasio Auerbach. Kalyani Maeda   PAC  12/12/2011, 8:35 AM

## 2011-12-13 ENCOUNTER — Encounter (HOSPITAL_COMMUNITY): Payer: Self-pay | Admitting: Specialist

## 2011-12-15 NOTE — Discharge Summary (Signed)
Physician Discharge Summary  Patient ID: Nathan Collins MRN: [id number] DOB/AGE: [birth year] 61 y.o.  Admit date: 12/10/2011 Discharge date: 12/15/2011  Admission Diagnoses: End-stage osteoarthritis left knee Hypertension Gout  Discharge Diagnoses: Status post left total knee arthroplasty Hypertension stable asymptomatic  Gout asymptomatic   Discharged Condition: good  Hospital Course: *Patient was admitted [facility] under the care of Dr. Valma Cava taken to the or were a left total knee arthroplasty was performed under spinal anesthesia with monitored sedation patient had no complications one Hemovac drain was left in place tourniquet was used patient was transferred to recovery room and orthopedic floor for routine total knee arthroplasty protocol. His vital signs remained stable throughout his postoperative care patient did very well. He should she retract drain was DC'd on postop day #1 IV fluids were cut back. Physical therapy nicely on a daily basis. Dressing was changed his wound was benign his leg was neuromotor vascularly intact. He was decided on postop day #2 from a physical therapy standpoint and orthopedic standpoint that he A. she was ready for discharge to home with outpatient home health physical therapy the patient was willing to go so arrangements were made and he was discharged home with home health physical therapy and 2 week followup appointment) provided with his prescriptions**  Consults: None   Significant Diagnostic Studies: Routine total knee arthroplasty labs  Treatments: Routine total knee arthroplasty protocol  Discharge Exam: Blood pressure 137/78, pulse 95, temperature 99.4 F (37.4 C), temperature source Oral, resp. rate 16, height 5\' 7"  (1.702 m), weight 95.709 kg (211 lb), SpO2 98.00%. Patient was reportedly conscious alert and appropriate appeared to be in no distress his wound was reported to be benign for any signs of infection well  approximated with Steri-Strips his calf and thigh were soft and nontender his leg was neuromotor vascularly intact  Disposition: 06-Home-Health Care Svc  Discharge Orders    Future Orders Please Complete By Expires   Diet - low sodium heart healthy      Call MD / Call 911      Comments:   If you experience chest pain or shortness of breath, CALL 911 and be transported to the hospital emergency room.  If you develope a fever above 101 F, pus (white drainage) or increased drainage or redness at the wound, or calf pain, call your surgeon's office.   Discharge instructions      Comments:   Daily dressing changes with gauze and tape. Keep the area dry and clean until follow up. Follow up in 2 weeks at [facility]. Call with any questions or concerns.   Constipation Prevention      Comments:   Drink plenty of fluids.  Prune juice may be helpful.  You may use a stool softener, such as Colace (over the counter) 100 mg twice a day.  Use MiraLax (over the counter) for constipation as needed.   Increase activity slowly as tolerated      TED hose      Comments:   Use stockings (TED hose) for 2 weeks on both leg(s).  You may remove them at night for sleeping.   Change dressing      Comments:   Change the dressing daily with sterile 4 x 4 inch gauze dressing and tape. Keep the area dry and clean.       Medication List     As of 12/15/2011  7:18 AM    STOP taking these medications  ibuprofen 200 MG tablet   Commonly known as: ADVIL,MOTRIN      TAKE these medications         acetaminophen 500 MG tablet   Commonly known as: TYLENOL   Take 500 mg by mouth every 6 (six) hours as needed. For pain      allopurinol 100 MG tablet   Commonly known as: ZYLOPRIM   Take 200 mg by mouth every morning.      aspirin EC 325 MG tablet   Take 1 tablet (325 mg total) by mouth 2 (two) times daily. X 4 weeks      DSS 100 MG Caps   Take 100 mg by mouth 2 (two) times daily.       ferrous sulfate 325 (65 FE) MG tablet   Take 1 tablet (325 mg total) by mouth 3 (three) times daily after meals.      losartan 100 MG tablet   Commonly known as: COZAAR   Take 100 mg by mouth every morning.      methocarbamol 500 MG tablet   Commonly known as: ROBAXIN   Take 1 tablet (500 mg total) by mouth every 6 (six) hours as needed (muscle spasms).      oxyCODONE 5 MG immediate release tablet   Commonly known as: Oxy IR/ROXICODONE   Take 1-2 tablets (5-10 mg total) by mouth every 4 (four) hours as needed for pain.      polyethylene glycol packet   Commonly known as: MIRALAX / GLYCOLAX   Take 17 g by mouth 2 (two) times daily.           Follow-up Information    Follow up with Erasmo Leventhal, MD. In 2 weeks.   Contact information:   [facility] [address] [place] [address] [phone]          Signed: Jamelle Rushing 12/15/2011, 7:18 AM

## 2015-07-09 DIAGNOSIS — Z85820 Personal history of malignant melanoma of skin: Secondary | ICD-10-CM | POA: Diagnosis not present

## 2015-07-09 DIAGNOSIS — L821 Other seborrheic keratosis: Secondary | ICD-10-CM | POA: Diagnosis not present

## 2015-09-08 DIAGNOSIS — G4733 Obstructive sleep apnea (adult) (pediatric): Secondary | ICD-10-CM | POA: Diagnosis not present

## 2015-11-10 DIAGNOSIS — N529 Male erectile dysfunction, unspecified: Secondary | ICD-10-CM | POA: Diagnosis not present

## 2015-11-10 DIAGNOSIS — R7301 Impaired fasting glucose: Secondary | ICD-10-CM | POA: Diagnosis not present

## 2015-11-10 DIAGNOSIS — I10 Essential (primary) hypertension: Secondary | ICD-10-CM | POA: Diagnosis not present

## 2015-11-10 DIAGNOSIS — Z0000 Encounter for general adult medical examination without abnormal findings: Secondary | ICD-10-CM | POA: Diagnosis not present

## 2015-11-10 DIAGNOSIS — E785 Hyperlipidemia, unspecified: Secondary | ICD-10-CM | POA: Diagnosis not present

## 2015-11-10 DIAGNOSIS — Z23 Encounter for immunization: Secondary | ICD-10-CM | POA: Diagnosis not present

## 2015-11-10 DIAGNOSIS — M109 Gout, unspecified: Secondary | ICD-10-CM | POA: Diagnosis not present

## 2015-11-10 DIAGNOSIS — G4733 Obstructive sleep apnea (adult) (pediatric): Secondary | ICD-10-CM | POA: Diagnosis not present

## 2015-11-17 DIAGNOSIS — M1612 Unilateral primary osteoarthritis, left hip: Secondary | ICD-10-CM | POA: Diagnosis not present

## 2015-11-17 DIAGNOSIS — M5442 Lumbago with sciatica, left side: Secondary | ICD-10-CM | POA: Diagnosis not present

## 2015-11-21 DIAGNOSIS — M109 Gout, unspecified: Secondary | ICD-10-CM | POA: Diagnosis not present

## 2015-11-21 DIAGNOSIS — R7303 Prediabetes: Secondary | ICD-10-CM | POA: Diagnosis not present

## 2015-11-21 DIAGNOSIS — I10 Essential (primary) hypertension: Secondary | ICD-10-CM | POA: Diagnosis not present

## 2015-11-21 DIAGNOSIS — E785 Hyperlipidemia, unspecified: Secondary | ICD-10-CM | POA: Diagnosis not present

## 2015-11-21 DIAGNOSIS — Z125 Encounter for screening for malignant neoplasm of prostate: Secondary | ICD-10-CM | POA: Diagnosis not present

## 2015-12-04 DIAGNOSIS — M5442 Lumbago with sciatica, left side: Secondary | ICD-10-CM | POA: Diagnosis not present

## 2015-12-10 DIAGNOSIS — M5442 Lumbago with sciatica, left side: Secondary | ICD-10-CM | POA: Diagnosis not present

## 2015-12-11 DIAGNOSIS — M5442 Lumbago with sciatica, left side: Secondary | ICD-10-CM | POA: Diagnosis not present

## 2015-12-15 DIAGNOSIS — M5442 Lumbago with sciatica, left side: Secondary | ICD-10-CM | POA: Diagnosis not present

## 2015-12-22 DIAGNOSIS — M5442 Lumbago with sciatica, left side: Secondary | ICD-10-CM | POA: Diagnosis not present

## 2015-12-26 DIAGNOSIS — M5442 Lumbago with sciatica, left side: Secondary | ICD-10-CM | POA: Diagnosis not present

## 2016-01-19 DIAGNOSIS — L821 Other seborrheic keratosis: Secondary | ICD-10-CM | POA: Diagnosis not present

## 2016-01-19 DIAGNOSIS — L814 Other melanin hyperpigmentation: Secondary | ICD-10-CM | POA: Diagnosis not present

## 2016-01-19 DIAGNOSIS — Z85820 Personal history of malignant melanoma of skin: Secondary | ICD-10-CM | POA: Diagnosis not present

## 2016-01-19 DIAGNOSIS — D235 Other benign neoplasm of skin of trunk: Secondary | ICD-10-CM | POA: Diagnosis not present

## 2016-01-19 DIAGNOSIS — D1801 Hemangioma of skin and subcutaneous tissue: Secondary | ICD-10-CM | POA: Diagnosis not present

## 2016-04-08 DIAGNOSIS — M9903 Segmental and somatic dysfunction of lumbar region: Secondary | ICD-10-CM | POA: Diagnosis not present

## 2016-04-08 DIAGNOSIS — M5432 Sciatica, left side: Secondary | ICD-10-CM | POA: Diagnosis not present

## 2016-04-14 DIAGNOSIS — M9903 Segmental and somatic dysfunction of lumbar region: Secondary | ICD-10-CM | POA: Diagnosis not present

## 2016-04-14 DIAGNOSIS — M5432 Sciatica, left side: Secondary | ICD-10-CM | POA: Diagnosis not present

## 2016-04-19 DIAGNOSIS — M5432 Sciatica, left side: Secondary | ICD-10-CM | POA: Diagnosis not present

## 2016-04-19 DIAGNOSIS — M9903 Segmental and somatic dysfunction of lumbar region: Secondary | ICD-10-CM | POA: Diagnosis not present

## 2016-04-26 DIAGNOSIS — H9011 Conductive hearing loss, unilateral, right ear, with unrestricted hearing on the contralateral side: Secondary | ICD-10-CM | POA: Diagnosis not present

## 2016-04-26 DIAGNOSIS — M9903 Segmental and somatic dysfunction of lumbar region: Secondary | ICD-10-CM | POA: Diagnosis not present

## 2016-04-26 DIAGNOSIS — M109 Gout, unspecified: Secondary | ICD-10-CM | POA: Diagnosis not present

## 2016-04-26 DIAGNOSIS — E7800 Pure hypercholesterolemia, unspecified: Secondary | ICD-10-CM | POA: Diagnosis not present

## 2016-04-26 DIAGNOSIS — N529 Male erectile dysfunction, unspecified: Secondary | ICD-10-CM | POA: Diagnosis not present

## 2016-04-26 DIAGNOSIS — M5432 Sciatica, left side: Secondary | ICD-10-CM | POA: Diagnosis not present

## 2016-04-26 DIAGNOSIS — I10 Essential (primary) hypertension: Secondary | ICD-10-CM | POA: Diagnosis not present

## 2016-04-26 DIAGNOSIS — H6121 Impacted cerumen, right ear: Secondary | ICD-10-CM | POA: Diagnosis not present

## 2016-04-26 DIAGNOSIS — G4733 Obstructive sleep apnea (adult) (pediatric): Secondary | ICD-10-CM | POA: Diagnosis not present

## 2016-04-27 DIAGNOSIS — M9903 Segmental and somatic dysfunction of lumbar region: Secondary | ICD-10-CM | POA: Diagnosis not present

## 2016-04-27 DIAGNOSIS — H6061 Unspecified chronic otitis externa, right ear: Secondary | ICD-10-CM | POA: Diagnosis not present

## 2016-04-27 DIAGNOSIS — M5432 Sciatica, left side: Secondary | ICD-10-CM | POA: Diagnosis not present

## 2016-04-27 DIAGNOSIS — H6122 Impacted cerumen, left ear: Secondary | ICD-10-CM | POA: Diagnosis not present

## 2016-05-05 DIAGNOSIS — M5432 Sciatica, left side: Secondary | ICD-10-CM | POA: Diagnosis not present

## 2016-05-05 DIAGNOSIS — H903 Sensorineural hearing loss, bilateral: Secondary | ICD-10-CM | POA: Diagnosis not present

## 2016-05-05 DIAGNOSIS — M9903 Segmental and somatic dysfunction of lumbar region: Secondary | ICD-10-CM | POA: Diagnosis not present

## 2016-05-05 DIAGNOSIS — H6061 Unspecified chronic otitis externa, right ear: Secondary | ICD-10-CM | POA: Diagnosis not present

## 2016-05-05 DIAGNOSIS — H6121 Impacted cerumen, right ear: Secondary | ICD-10-CM | POA: Diagnosis not present

## 2016-05-06 DIAGNOSIS — M5432 Sciatica, left side: Secondary | ICD-10-CM | POA: Diagnosis not present

## 2016-05-06 DIAGNOSIS — M9903 Segmental and somatic dysfunction of lumbar region: Secondary | ICD-10-CM | POA: Diagnosis not present

## 2016-05-10 DIAGNOSIS — M5432 Sciatica, left side: Secondary | ICD-10-CM | POA: Diagnosis not present

## 2016-05-10 DIAGNOSIS — M9903 Segmental and somatic dysfunction of lumbar region: Secondary | ICD-10-CM | POA: Diagnosis not present

## 2016-05-13 DIAGNOSIS — M9903 Segmental and somatic dysfunction of lumbar region: Secondary | ICD-10-CM | POA: Diagnosis not present

## 2016-05-13 DIAGNOSIS — M5432 Sciatica, left side: Secondary | ICD-10-CM | POA: Diagnosis not present

## 2016-05-17 DIAGNOSIS — H906 Mixed conductive and sensorineural hearing loss, bilateral: Secondary | ICD-10-CM | POA: Diagnosis not present

## 2016-05-17 DIAGNOSIS — M9903 Segmental and somatic dysfunction of lumbar region: Secondary | ICD-10-CM | POA: Diagnosis not present

## 2016-05-17 DIAGNOSIS — M5432 Sciatica, left side: Secondary | ICD-10-CM | POA: Diagnosis not present

## 2016-05-20 DIAGNOSIS — M9903 Segmental and somatic dysfunction of lumbar region: Secondary | ICD-10-CM | POA: Diagnosis not present

## 2016-05-20 DIAGNOSIS — M5432 Sciatica, left side: Secondary | ICD-10-CM | POA: Diagnosis not present

## 2016-05-24 DIAGNOSIS — M9903 Segmental and somatic dysfunction of lumbar region: Secondary | ICD-10-CM | POA: Diagnosis not present

## 2016-05-24 DIAGNOSIS — M5432 Sciatica, left side: Secondary | ICD-10-CM | POA: Diagnosis not present

## 2016-06-14 DIAGNOSIS — M9903 Segmental and somatic dysfunction of lumbar region: Secondary | ICD-10-CM | POA: Diagnosis not present

## 2016-06-14 DIAGNOSIS — M5432 Sciatica, left side: Secondary | ICD-10-CM | POA: Diagnosis not present

## 2016-07-26 DIAGNOSIS — L821 Other seborrheic keratosis: Secondary | ICD-10-CM | POA: Diagnosis not present

## 2016-07-26 DIAGNOSIS — D1801 Hemangioma of skin and subcutaneous tissue: Secondary | ICD-10-CM | POA: Diagnosis not present

## 2016-07-26 DIAGNOSIS — Z85820 Personal history of malignant melanoma of skin: Secondary | ICD-10-CM | POA: Diagnosis not present

## 2016-07-26 DIAGNOSIS — D225 Melanocytic nevi of trunk: Secondary | ICD-10-CM | POA: Diagnosis not present

## 2016-09-13 DIAGNOSIS — G4733 Obstructive sleep apnea (adult) (pediatric): Secondary | ICD-10-CM | POA: Diagnosis not present

## 2016-10-29 DIAGNOSIS — H25013 Cortical age-related cataract, bilateral: Secondary | ICD-10-CM | POA: Diagnosis not present

## 2016-10-29 DIAGNOSIS — H35033 Hypertensive retinopathy, bilateral: Secondary | ICD-10-CM | POA: Diagnosis not present

## 2016-10-29 DIAGNOSIS — H2513 Age-related nuclear cataract, bilateral: Secondary | ICD-10-CM | POA: Diagnosis not present

## 2016-10-29 DIAGNOSIS — H43813 Vitreous degeneration, bilateral: Secondary | ICD-10-CM | POA: Diagnosis not present

## 2016-10-29 DIAGNOSIS — H35372 Puckering of macula, left eye: Secondary | ICD-10-CM | POA: Diagnosis not present

## 2016-11-10 DIAGNOSIS — Z0000 Encounter for general adult medical examination without abnormal findings: Secondary | ICD-10-CM | POA: Diagnosis not present

## 2016-11-10 DIAGNOSIS — N529 Male erectile dysfunction, unspecified: Secondary | ICD-10-CM | POA: Diagnosis not present

## 2016-11-10 DIAGNOSIS — Z1211 Encounter for screening for malignant neoplasm of colon: Secondary | ICD-10-CM | POA: Diagnosis not present

## 2016-11-10 DIAGNOSIS — M109 Gout, unspecified: Secondary | ICD-10-CM | POA: Diagnosis not present

## 2016-11-10 DIAGNOSIS — Z125 Encounter for screening for malignant neoplasm of prostate: Secondary | ICD-10-CM | POA: Diagnosis not present

## 2016-11-10 DIAGNOSIS — Z1389 Encounter for screening for other disorder: Secondary | ICD-10-CM | POA: Diagnosis not present

## 2016-11-10 DIAGNOSIS — Z23 Encounter for immunization: Secondary | ICD-10-CM | POA: Diagnosis not present

## 2016-11-10 DIAGNOSIS — E785 Hyperlipidemia, unspecified: Secondary | ICD-10-CM | POA: Diagnosis not present

## 2016-11-10 DIAGNOSIS — G4733 Obstructive sleep apnea (adult) (pediatric): Secondary | ICD-10-CM | POA: Diagnosis not present

## 2016-11-10 DIAGNOSIS — I10 Essential (primary) hypertension: Secondary | ICD-10-CM | POA: Diagnosis not present

## 2016-11-10 LAB — LIPID PANEL
Cholesterol: 223 — AB (ref 0–200)
HDL: 37 (ref 35–70)
LDL CALC: 153
Triglycerides: 165 — AB (ref 40–160)

## 2016-11-14 LAB — BASIC METABOLIC PANEL
BUN: 21 (ref 4–21)
Creatinine: 1.1 (ref 0.6–1.3)
GLUCOSE: 107
Potassium: 4.9 (ref 3.4–5.3)
Sodium: 144 (ref 137–147)

## 2016-11-14 LAB — HEPATIC FUNCTION PANEL
ALT: 24 (ref 10–40)
AST: 20 (ref 14–40)
BILIRUBIN, TOTAL: 0.5

## 2016-11-14 LAB — CBC AND DIFFERENTIAL
HCT: 44 (ref 41–53)
Hemoglobin: 14.9 (ref 13.5–17.5)
Platelets: 177 (ref 150–399)
WBC: 6.3

## 2016-11-16 DIAGNOSIS — H6122 Impacted cerumen, left ear: Secondary | ICD-10-CM | POA: Diagnosis not present

## 2017-03-04 DIAGNOSIS — D126 Benign neoplasm of colon, unspecified: Secondary | ICD-10-CM | POA: Diagnosis not present

## 2017-03-04 DIAGNOSIS — Z1211 Encounter for screening for malignant neoplasm of colon: Secondary | ICD-10-CM | POA: Diagnosis not present

## 2017-03-04 DIAGNOSIS — K635 Polyp of colon: Secondary | ICD-10-CM | POA: Diagnosis not present

## 2017-03-09 DIAGNOSIS — K635 Polyp of colon: Secondary | ICD-10-CM | POA: Diagnosis not present

## 2017-03-09 DIAGNOSIS — Z1211 Encounter for screening for malignant neoplasm of colon: Secondary | ICD-10-CM | POA: Diagnosis not present

## 2017-03-09 DIAGNOSIS — D126 Benign neoplasm of colon, unspecified: Secondary | ICD-10-CM | POA: Diagnosis not present

## 2017-05-09 DIAGNOSIS — E785 Hyperlipidemia, unspecified: Secondary | ICD-10-CM | POA: Diagnosis not present

## 2017-05-09 DIAGNOSIS — M109 Gout, unspecified: Secondary | ICD-10-CM | POA: Diagnosis not present

## 2017-05-09 DIAGNOSIS — I10 Essential (primary) hypertension: Secondary | ICD-10-CM | POA: Diagnosis not present

## 2017-05-09 DIAGNOSIS — R7301 Impaired fasting glucose: Secondary | ICD-10-CM | POA: Diagnosis not present

## 2017-05-09 DIAGNOSIS — G4733 Obstructive sleep apnea (adult) (pediatric): Secondary | ICD-10-CM | POA: Diagnosis not present

## 2017-05-11 LAB — LIPID PANEL
Cholesterol: 153 (ref 0–200)
HDL: 37 (ref 35–70)
LDL Cholesterol: 98
Triglycerides: 87 (ref 40–160)

## 2017-05-11 LAB — BASIC METABOLIC PANEL
BUN: 18 (ref 4–21)
CREATININE: 1.1 (ref <=1.3)
GLUCOSE: 113
POTASSIUM: 4.7 (ref 3.4–5.3)
SODIUM: 143 (ref 137–147)

## 2017-05-11 LAB — HEMOGLOBIN A1C: Hemoglobin A1C: 5.9

## 2017-07-25 DIAGNOSIS — L718 Other rosacea: Secondary | ICD-10-CM | POA: Diagnosis not present

## 2017-07-25 DIAGNOSIS — L821 Other seborrheic keratosis: Secondary | ICD-10-CM | POA: Diagnosis not present

## 2017-07-25 DIAGNOSIS — L814 Other melanin hyperpigmentation: Secondary | ICD-10-CM | POA: Diagnosis not present

## 2017-07-25 DIAGNOSIS — L738 Other specified follicular disorders: Secondary | ICD-10-CM | POA: Diagnosis not present

## 2017-07-25 DIAGNOSIS — D229 Melanocytic nevi, unspecified: Secondary | ICD-10-CM | POA: Diagnosis not present

## 2017-07-25 DIAGNOSIS — Z85820 Personal history of malignant melanoma of skin: Secondary | ICD-10-CM | POA: Diagnosis not present

## 2017-07-25 DIAGNOSIS — D1801 Hemangioma of skin and subcutaneous tissue: Secondary | ICD-10-CM | POA: Diagnosis not present

## 2017-09-12 DIAGNOSIS — G4733 Obstructive sleep apnea (adult) (pediatric): Secondary | ICD-10-CM | POA: Diagnosis not present

## 2017-10-11 ENCOUNTER — Encounter: Payer: Self-pay | Admitting: Family Medicine

## 2017-10-11 ENCOUNTER — Ambulatory Visit (INDEPENDENT_AMBULATORY_CARE_PROVIDER_SITE_OTHER): Payer: Medicare Other | Admitting: Family Medicine

## 2017-10-11 DIAGNOSIS — Z8042 Family history of malignant neoplasm of prostate: Secondary | ICD-10-CM | POA: Diagnosis not present

## 2017-10-11 DIAGNOSIS — G4733 Obstructive sleep apnea (adult) (pediatric): Secondary | ICD-10-CM | POA: Diagnosis not present

## 2017-10-11 DIAGNOSIS — Z9989 Dependence on other enabling machines and devices: Secondary | ICD-10-CM

## 2017-10-11 DIAGNOSIS — J309 Allergic rhinitis, unspecified: Secondary | ICD-10-CM | POA: Diagnosis not present

## 2017-10-11 DIAGNOSIS — E785 Hyperlipidemia, unspecified: Secondary | ICD-10-CM

## 2017-10-11 DIAGNOSIS — Z85820 Personal history of malignant melanoma of skin: Secondary | ICD-10-CM

## 2017-10-11 DIAGNOSIS — I10 Essential (primary) hypertension: Secondary | ICD-10-CM | POA: Diagnosis not present

## 2017-10-11 DIAGNOSIS — M109 Gout, unspecified: Secondary | ICD-10-CM | POA: Diagnosis not present

## 2017-10-11 DIAGNOSIS — R739 Hyperglycemia, unspecified: Secondary | ICD-10-CM | POA: Insufficient documentation

## 2017-10-11 NOTE — Assessment & Plan Note (Signed)
Continue lifestyle modifications. Check lipid panel with next blood draw.  

## 2017-10-11 NOTE — Progress Notes (Addendum)
Subjective:  Nathan Collins is a 67 y.o. male who presents today with a chief complaint of gout and to establish care.   HPI:  Gout, chronic problem, stable Several year history.  Patient had a flareup depending on what he eats.  Takes allopurinol 200 mg daily without reported side effects.  Does not remember his last uric acid level.  No current flares.  Hypertension, chronic problem, stable On losartan 100 mg daily.  Tolerates well without reported side effects.  Obstructive sleep apnea, chronic problem, stable Several year history.  Compliant with CPAP.  History of melanoma, chronic problem Follow-up with dermatology once yearly  Family history prostate cancer Has PSA checked yearly.  Has always been within normal ranges.  Rhinorrhea, chronic problem, stable Started 3 to 4 years ago.  Worse in the morning.  No clear precipitating event.  No treatments tried.  Occasional cough.  HLD/Hyperglycemia, chronic problem Not currently on any medications.  He has been modifying his diet by cutting out carbs and alcohol.  ROS: Per HPI, otherwise a complete review of systems was negative.   PMH:  The following were reviewed and entered/updated in epic: Past Medical History:  Diagnosis Date  . Arthritis   . Cancer (HCC)    HX OF MALIGNANT MELANOMA SHOULDER   . Hypertension   . Sleep apnea    CPAP- 4 GOES TO 12 OR 14    Patient Active Problem List   Diagnosis Date Noted  . Gout 10/11/2017  . Essential hypertension 10/11/2017  . OSA on CPAP 10/11/2017  . History of melanoma 10/11/2017  . Family history of prostate cancer 10/11/2017  . Allergic rhinitis 10/11/2017  . Dyslipidemia 10/11/2017  . Hyperglycemia 10/11/2017  . S/P left TKA 12/12/2011   Past Surgical History:  Procedure Laterality Date  . APPENDECTOMY    . JOINT REPLACEMENT     RIGHT KNEE REPLACEMENT   . KNEE ARTHROSCOPY     LEFT   . RIGHT KNEE SURGERY     . TOTAL KNEE ARTHROPLASTY  12/10/2011   Procedure: TOTAL KNEE ARTHROPLASTY;  Surgeon: [name], MD;  Location: [facility];  Service: Orthopedics;  Laterality: Left;    Family History  Problem Relation Age of Onset  . Diabetes Father   . Prostate cancer Father   . Bladder Cancer Mother   . Peripheral Artery Disease Mother   . Arrhythmia Mother     Medications- reviewed and updated Current Outpatient Medications  Medication Sig Dispense Refill  . allopurinol (ZYLOPRIM) 100 MG tablet Take 200 mg by mouth every morning.    . Coenzyme Q10 (CO Q 10 PO) Take by mouth.    . losartan (COZAAR) 100 MG tablet Take 100 mg by mouth every morning.    . Multiple Vitamin (MULTIVITAMIN) tablet Take 1 tablet by mouth daily.     No current facility-administered medications for this visit.     Allergies-reviewed and updated Allergies  Allergen Reactions  . Indocin [Indomethacin] Hives    No reaction to other NSAIDs.     Social History   Socioeconomic History  . Marital status: Married    Spouse name: Not on file  . Number of children: Not on file  . Years of education: Not on file  . Highest education level: Not on file  Occupational History  . Not on file  Social Needs  . Financial resource strain: Not on file  . Food insecurity:    Worry: Not on file  Inability: Not on file  . Transportation needs:    Medical: Not on file    Non-medical: Not on file  Tobacco Use  . Smoking status: Never Smoker  . Smokeless tobacco: Never Used  Substance and Sexual Activity  . Alcohol use: Not Currently  . Drug use: No  . Sexual activity: Yes    Partners: Female  Lifestyle  . Physical activity:    Days per week: Not on file    Minutes per session: Not on file  . Stress: Not on file  Relationships  . Social connections:    Talks on phone: Not on file    Gets together: Not on file    Attends religious service: Not on file    Active member of club or organization: Not on file    Attends meetings of clubs or organizations:  Not on file    Relationship status: Not on file  Other Topics Concern  . Not on file  Social History Narrative  . Not on file    Objective:  Physical Exam: BP 134/82 (BP Location: Left Arm, Patient Position: Sitting, Cuff Size: Normal)   Pulse 65   Temp 98.5 F (36.9 C) (Oral)   Ht 5\' 8"  (1.727 m)   Wt 216 lb 6.4 oz (98.2 kg)   SpO2 94%   BMI 32.90 kg/m   Gen: NAD, resting comfortably HEENT: TMs clear.  Nasal mucosa boggy and erythematous bilaterally. CV: RRR with soft, 2/6 systolic murmur appreciated Pulm: NWOB, CTAB with no crackles, wheezes, or rhonchi GI: Normal bowel sounds present. Soft, Nontender, Nondistended. MSK: No edema, cyanosis, or clubbing noted Skin: Warm, dry Neuro: Grossly normal, moves all extremities Psych: Normal affect and thought content  Assessment/Plan:  Gout Stable on allopurinol 200 mg daily.  Obtain records.  Will need uric acid level with next blood draw.  Essential hypertension At goal on losartan 100 mg daily.  Continue this.  Obtain prior records.  Check CMET with next blood draw.  OSA on CPAP Stable.  Continue CPAP at night.  History of melanoma Stable.  Follows up with dermatology once yearly.  Family history of prostate cancer Check PSA with next blood draw.  Obtain prior records.  Allergic rhinitis No red flag signs or symptoms.  Recommended over-the-counter allergy medication such as Claritin or Zyrtec.  Also recommended intranasal corticosteroid such as fluticasone.  Discussed reasons to return to care.  Dyslipidemia Continue lifestyle modifications.  Check lipid panel with next blood draw.  Hyperglycemia Encouraged continued lifestyle modifications. Check glucose with next blood draw.   Preventative Healthcare Patient was instructed to return soon for AWV. Health Maintenance Due  Topic Date Due  . Hepatitis C Screening  14-Sep-1950  . INFLUENZA VACCINE  09/22/2017   Algis Greenhouse. Jerline Pain, MD 10/11/2017 10:06 AM

## 2017-10-11 NOTE — Assessment & Plan Note (Signed)
Check PSA with next blood draw.  Obtain prior records.

## 2017-10-11 NOTE — Assessment & Plan Note (Signed)
Stable on allopurinol 200 mg daily.  Obtain records.  Will need uric acid level with next blood draw.

## 2017-10-11 NOTE — Assessment & Plan Note (Signed)
Stable.  Continue CPAP at night.

## 2017-10-11 NOTE — Assessment & Plan Note (Signed)
At goal on losartan 100 mg daily.  Continue this.  Obtain prior records.  Check CMET with next blood draw.

## 2017-10-11 NOTE — Assessment & Plan Note (Signed)
Encouraged continued lifestyle modifications. Check glucose with next blood draw.

## 2017-10-11 NOTE — Assessment & Plan Note (Signed)
Stable.  Follows up with dermatology once yearly.

## 2017-10-11 NOTE — Assessment & Plan Note (Signed)
No red flag signs or symptoms.  Recommended over-the-counter allergy medication such as Claritin or Zyrtec.  Also recommended intranasal corticosteroid such as fluticasone.  Discussed reasons to return to care.

## 2017-10-11 NOTE — Patient Instructions (Signed)
It was very nice to see you today!  No medication changes today.  You can try taking an over the counter allergy med or flonase for your runny nose.   Keep up the good work!  Please come back soon for an annual wellness visit.  Come back to see me in 1 year, or sooner as needed.  Take care, Dr Jerline Pain

## 2017-11-04 DIAGNOSIS — H35372 Puckering of macula, left eye: Secondary | ICD-10-CM | POA: Diagnosis not present

## 2017-11-04 DIAGNOSIS — H35033 Hypertensive retinopathy, bilateral: Secondary | ICD-10-CM | POA: Diagnosis not present

## 2017-11-04 DIAGNOSIS — H43813 Vitreous degeneration, bilateral: Secondary | ICD-10-CM | POA: Diagnosis not present

## 2017-11-04 DIAGNOSIS — H2513 Age-related nuclear cataract, bilateral: Secondary | ICD-10-CM | POA: Diagnosis not present

## 2017-11-04 DIAGNOSIS — H25013 Cortical age-related cataract, bilateral: Secondary | ICD-10-CM | POA: Diagnosis not present

## 2017-12-02 ENCOUNTER — Encounter: Payer: Self-pay | Admitting: Nurse Practitioner

## 2017-12-19 ENCOUNTER — Ambulatory Visit: Payer: Medicare Other | Admitting: Nurse Practitioner

## 2017-12-21 DIAGNOSIS — M9903 Segmental and somatic dysfunction of lumbar region: Secondary | ICD-10-CM | POA: Diagnosis not present

## 2017-12-21 DIAGNOSIS — M5432 Sciatica, left side: Secondary | ICD-10-CM | POA: Diagnosis not present

## 2017-12-22 DIAGNOSIS — M5432 Sciatica, left side: Secondary | ICD-10-CM | POA: Diagnosis not present

## 2017-12-22 DIAGNOSIS — M9903 Segmental and somatic dysfunction of lumbar region: Secondary | ICD-10-CM | POA: Diagnosis not present

## 2017-12-23 ENCOUNTER — Telehealth: Payer: Self-pay | Admitting: Family Medicine

## 2017-12-23 MED ORDER — LOSARTAN POTASSIUM 100 MG PO TABS: 100.0000 mg | ORAL_TABLET | Freq: Every morning | ORAL | 1 refills | Status: DC

## 2017-12-23 MED ORDER — ALLOPURINOL 100 MG PO TABS: 200.0000 mg | ORAL_TABLET | Freq: Every morning | ORAL | 3 refills | Status: DC

## 2017-12-23 NOTE — Telephone Encounter (Signed)
Copied from [place] [phone]. Topic: Quick Communication - Rx Refill/Question >> Dec 23, 2017  3:22 PM Scherrie Gerlach wrote: Medication: allopurinol (ZYLOPRIM) 100 MG tablet  losartan (COZAAR) 100 MG tablet  Atorvastatin 10 mg  pt is new to Dr Jerline Pain and requesting these meds  [facility] [id number], [place] - [place] [phone] (Phone) [phone] (Fax)

## 2017-12-23 NOTE — Telephone Encounter (Signed)
Please review for refills: allopurinol 100 mg, historical medication, no last refill date Losartan 100 mg, historical medication, no last refill date  Dr. Jerline Pain

## 2017-12-23 NOTE — Telephone Encounter (Signed)
See note

## 2017-12-26 ENCOUNTER — Other Ambulatory Visit: Payer: Self-pay

## 2017-12-26 DIAGNOSIS — M5432 Sciatica, left side: Secondary | ICD-10-CM | POA: Diagnosis not present

## 2017-12-26 DIAGNOSIS — M9903 Segmental and somatic dysfunction of lumbar region: Secondary | ICD-10-CM | POA: Diagnosis not present

## 2017-12-26 MED ORDER — ALLOPURINOL 100 MG PO TABS: 200.0000 mg | ORAL_TABLET | Freq: Every morning | ORAL | 0 refills | Status: DC

## 2017-12-26 NOTE — Telephone Encounter (Signed)
Pt. Is requesting a 90 day supply of allopurinol; please advise.

## 2017-12-26 NOTE — Telephone Encounter (Signed)
See note

## 2017-12-26 NOTE — Telephone Encounter (Signed)
Rx sent to pharmacy   

## 2017-12-26 NOTE — Telephone Encounter (Signed)
The patient's allopurinol (ZYLOPRIM) 100 MG tablet medication was sent to [facility] as a 30 day supply, but the patient requested a 90day supply.  [facility] will need a new prescription sent over for the 90day supply.

## 2017-12-27 DIAGNOSIS — M9903 Segmental and somatic dysfunction of lumbar region: Secondary | ICD-10-CM | POA: Diagnosis not present

## 2017-12-27 DIAGNOSIS — M5432 Sciatica, left side: Secondary | ICD-10-CM | POA: Diagnosis not present

## 2017-12-29 ENCOUNTER — Telehealth: Payer: Self-pay | Admitting: Family Medicine

## 2017-12-29 ENCOUNTER — Other Ambulatory Visit: Payer: Self-pay

## 2017-12-29 DIAGNOSIS — M9903 Segmental and somatic dysfunction of lumbar region: Secondary | ICD-10-CM | POA: Diagnosis not present

## 2017-12-29 DIAGNOSIS — M5432 Sciatica, left side: Secondary | ICD-10-CM | POA: Diagnosis not present

## 2017-12-29 MED ORDER — ALLOPURINOL 100 MG PO TABS: 200.0000 mg | ORAL_TABLET | Freq: Every morning | ORAL | 0 refills | Status: DC

## 2017-12-29 NOTE — Telephone Encounter (Signed)
Rx sent to pharmacy   

## 2017-12-29 NOTE — Telephone Encounter (Signed)
MEDICATION: allopurinol (ZYLOPRIM) 100 MG tablet  PHARMACY: [id number] - [place], [place] - [address]   IS THIS A 90 DAY SUPPLY : Yes  IS PATIENT OUT OF MEDICATION: No  IF NOT; HOW MUCH IS LEFT:   LAST APPOINTMENT DATE: @11 /02/2017  NEXT APPOINTMENT DATE:@Visit  date not found  OTHER COMMENTS:  Patient stated that medication refill was supposed to be a 90 day supply but he takes the medication 2x daily so he needs an additional amount of medication.    **Let patient know to contact pharmacy at the end of the day to make sure medication is ready. **  ** Please notify patient to allow 48-72 hours to process**  **Encourage patient to contact the pharmacy for refills or they can request refills through [facility]**

## 2018-01-03 DIAGNOSIS — M5432 Sciatica, left side: Secondary | ICD-10-CM | POA: Diagnosis not present

## 2018-01-03 DIAGNOSIS — M9903 Segmental and somatic dysfunction of lumbar region: Secondary | ICD-10-CM | POA: Diagnosis not present

## 2018-01-04 DIAGNOSIS — M9903 Segmental and somatic dysfunction of lumbar region: Secondary | ICD-10-CM | POA: Diagnosis not present

## 2018-01-04 DIAGNOSIS — M5432 Sciatica, left side: Secondary | ICD-10-CM | POA: Diagnosis not present

## 2018-01-05 DIAGNOSIS — M5432 Sciatica, left side: Secondary | ICD-10-CM | POA: Diagnosis not present

## 2018-01-05 DIAGNOSIS — M9903 Segmental and somatic dysfunction of lumbar region: Secondary | ICD-10-CM | POA: Diagnosis not present

## 2018-01-06 DIAGNOSIS — M545 Low back pain: Secondary | ICD-10-CM | POA: Diagnosis not present

## 2018-01-06 DIAGNOSIS — M5416 Radiculopathy, lumbar region: Secondary | ICD-10-CM | POA: Diagnosis not present

## 2018-01-11 DIAGNOSIS — M9903 Segmental and somatic dysfunction of lumbar region: Secondary | ICD-10-CM | POA: Diagnosis not present

## 2018-01-11 DIAGNOSIS — M5432 Sciatica, left side: Secondary | ICD-10-CM | POA: Diagnosis not present

## 2018-01-12 DIAGNOSIS — M5432 Sciatica, left side: Secondary | ICD-10-CM | POA: Diagnosis not present

## 2018-01-12 DIAGNOSIS — M9903 Segmental and somatic dysfunction of lumbar region: Secondary | ICD-10-CM | POA: Diagnosis not present

## 2018-01-16 ENCOUNTER — Encounter (INDEPENDENT_AMBULATORY_CARE_PROVIDER_SITE_OTHER): Payer: Self-pay

## 2018-01-16 ENCOUNTER — Encounter: Payer: Self-pay | Admitting: Nurse Practitioner

## 2018-01-16 ENCOUNTER — Ambulatory Visit (INDEPENDENT_AMBULATORY_CARE_PROVIDER_SITE_OTHER): Payer: Medicare Other | Admitting: Nurse Practitioner

## 2018-01-16 VITALS — BP 120/80 | HR 76 | Ht 67.0 in | Wt 209.0 lb

## 2018-01-16 DIAGNOSIS — I10 Essential (primary) hypertension: Secondary | ICD-10-CM

## 2018-01-16 DIAGNOSIS — Z8249 Family history of ischemic heart disease and other diseases of the circulatory system: Secondary | ICD-10-CM | POA: Diagnosis not present

## 2018-01-16 DIAGNOSIS — E7849 Other hyperlipidemia: Secondary | ICD-10-CM | POA: Diagnosis not present

## 2018-01-16 DIAGNOSIS — G4733 Obstructive sleep apnea (adult) (pediatric): Secondary | ICD-10-CM

## 2018-01-16 DIAGNOSIS — R011 Cardiac murmur, unspecified: Secondary | ICD-10-CM | POA: Diagnosis not present

## 2018-01-16 DIAGNOSIS — R9431 Abnormal electrocardiogram [ECG] [EKG]: Secondary | ICD-10-CM | POA: Diagnosis not present

## 2018-01-16 LAB — HEPATIC FUNCTION PANEL
ALT: 34 IU/L (ref 0–44)
AST: 19 IU/L (ref 0–40)
Albumin: 4.7 g/dL (ref 3.6–4.8)
Alkaline Phosphatase: 84 IU/L (ref 39–117)
Bilirubin Total: 0.4 mg/dL (ref 0.0–1.2)
Bilirubin, Direct: 0.11 mg/dL (ref 0.00–0.40)
Total Protein: 6.7 g/dL (ref 6.0–8.5)

## 2018-01-16 LAB — LIPID PANEL
Chol/HDL Ratio: 4 ratio (ref 0.0–5.0)
Cholesterol, Total: 177 mg/dL (ref 100–199)
HDL: 44 mg/dL (ref >39)
LDL Calculated: 100 mg/dL — ABNORMAL HIGH (ref 0–99)
Triglycerides: 163 mg/dL — ABNORMAL HIGH (ref 0–149)
VLDL Cholesterol Cal: 33 mg/dL (ref 5–40)

## 2018-01-16 LAB — BASIC METABOLIC PANEL
BUN/Creatinine Ratio: 20 (ref 10–24)
BUN: 27 mg/dL (ref 8–27)
CO2: 23 mmol/L (ref 20–29)
Calcium: 9.6 mg/dL (ref 8.6–10.2)
Chloride: 98 mmol/L (ref 96–106)
Creatinine, Ser: 1.34 mg/dL — ABNORMAL HIGH (ref 0.76–1.27)
GFR calc Af Amer: 63 mL/min/{1.73_m2} (ref >59)
GFR calc non Af Amer: 54 mL/min/{1.73_m2} — ABNORMAL LOW (ref >59)
Glucose: 104 mg/dL — ABNORMAL HIGH (ref 65–99)
Potassium: 4.3 mmol/L (ref 3.5–5.2)
Sodium: 139 mmol/L (ref 134–144)

## 2018-01-16 MED ORDER — METOPROLOL TARTRATE 100 MG PO TABS: 100.0000 mg | ORAL_TABLET | ORAL | 0 refills | Status: DC

## 2018-01-16 NOTE — Progress Notes (Signed)
CARDIOLOGY OFFICE NOTE  Date:  01/16/2018    [name] Date of Birth: 1950/05/16 Medical Record #[id number]  PCP:  Vivi Barrack, MD  Cardiologist:  Servando Snare   Chief Complaint  Patient presents with  . Hypertension  . Hyperlipidemia    New patient visit    History of Present Illness: Nathan Collins is a 67 y.o. male who presents today for a new patient visit. I see his wife - [name]. Dr. Radford Pax is the DOD today.   He has a history of HTN, gout, HLD, OSA and history of melanoma.   Comes in today. Here alone. His wife wanted him checked due to his risk factors and +FH for CAD. He denies chest pain. Not short of breath. No syncope.  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 He has an acute episode of sciatica - on Prednisone. He is now taking statin - apparently past lipids pretty high. Both parents developed heart disease in their 55's - dad had CHF/MI/multiple stents. Mom with PAD/CHF - she smoked. He has never smoked. BP ok on current antihypertensive regimen. He is compliant with his CPAP. He was told he had a murmur.   Past Medical History:  Diagnosis Date  . Arthritis   . Cancer (HCC)    HX OF MALIGNANT MELANOMA SHOULDER   . Hypertension   . Sleep apnea    CPAP- 4 GOES TO 12 OR 14     Past Surgical History:  Procedure Laterality Date  . APPENDECTOMY    . JOINT REPLACEMENT     RIGHT KNEE REPLACEMENT   . KNEE ARTHROSCOPY     LEFT   . RIGHT KNEE SURGERY     . TOTAL KNEE ARTHROPLASTY  12/10/2011   Procedure: TOTAL KNEE ARTHROPLASTY;  Surgeon: [name], MD;  Location: [facility];  Service: Orthopedics;  Laterality: Left;     Medications: Current Meds  Medication Sig  . allopurinol (ZYLOPRIM) 100 MG tablet Take 2 tablets (200 mg total) by mouth every morning.  [name] atorvastatin (LIPITOR) 10 MG tablet Take 10 mg by mouth daily at 6 PM.   . Coenzyme Q10 (CO Q 10 PO) Take by mouth.  . losartan (COZAAR) 100 MG tablet  Take 1 tablet (100 mg total) by mouth every morning.  . Multiple Vitamin (MULTIVITAMIN) tablet Take 1 tablet by mouth daily.  . predniSONE (DELTASONE) 20 MG tablet Take 20 mg by mouth daily with breakfast.      Allergies: Allergies  Allergen Reactions  . Indocin [Indomethacin] Hives    No reaction to other NSAIDs.     Social History: The patient  reports that he has never smoked. He has never used smokeless tobacco. He reports that he drank alcohol. He reports that he does not use drugs.   Family History: The patient's family history includes Arrhythmia in his mother; Bladder Cancer in his mother; Diabetes in his father; Heart failure in his father and mother; Hyperlipidemia in his mother; Peripheral Artery Disease in his mother; Prostate cancer in his father.   Review of Systems: Please see the history of present illness.   Otherwise, the review of systems is positive for none.   All other systems are reviewed and negative.   Physical Exam: VS:  BP 120/80 (BP Location: Left Arm, Patient Position: Sitting, Cuff Size: Normal)   Pulse 76   Ht 5\' 7"  (1.702 m)   Wt 209 lb (94.8  kg)   BMI 32.73 kg/m  .  BMI Body mass index is 32.73 kg/m.  Wt Readings from Last 3 Encounters:  01/16/18 209 lb (94.8 kg)  10/11/17 216 lb 6.4 oz (98.2 kg)  12/10/11 211 lb (95.7 kg)    General: Pleasant. Alert - he is having pain down his right leg from his sciatica but he is no acute distress.   HEENT: Normal.  Neck: Supple, no JVD, carotid bruits, or masses noted.  Cardiac: Regular rate and rhythm. Soft systolic murmur noted. No edema.  Respiratory:  Lungs are clear to auscultation bilaterally with normal work of breathing.  GI: Soft and nontender.  MS: No deformity or atrophy. Gait and ROM intact.  Skin: Warm and dry. Color is normal.  Neuro:  Strength and sensation are intact and no gross focal deficits noted.  Psych: Alert, appropriate and with normal affect.   LABORATORY DATA:  EKG:  EKG  is ordered today. This demonstrates NSR - he has inferior Q's noted.  Lab Results  Component Value Date   WBC 6.3 11/14/2016   HGB 14.9 11/14/2016   HCT 44 11/14/2016   PLT 177 11/14/2016   GLUCOSE 132 (H) 12/11/2011   CHOL 153 05/11/2017   TRIG 87 05/11/2017   HDL 37 05/11/2017   LDLCALC 98 05/11/2017   ALT 24 11/14/2016   AST 20 11/14/2016   NA 143 05/11/2017   K 4.7 05/11/2017   CL 99 12/11/2011   CREATININE 1.1 05/11/2017   BUN 18 05/11/2017   CO2 26 12/11/2011   INR 0.97 12/07/2011   HGBA1C 5.9 05/11/2017     BNP (last 3 results) No results for input(s): BNP in the last 8760 hours.  ProBNP (last 3 results) No results for input(s): PROBNP in the last 8760 hours.   Other Studies Reviewed Today:   Assessment/Plan:  1. Multiple CV risk factors (+FH/HTN/HLD) with abnormal EKG & murmur on exam - will arrange for echocardiogram to look at wall motion/diastolic dysfunction/LVH and arrange coronary CT - he is not able to perform GXT due to his sciatica but he says he can lay flat for the CT. Further disposition to follow.   2. HTN - BP ok on current regimen.   3. HLD - now on statin - he is fasting today - will recheck his labs today.   4. OSA - using CPAP  5. Obesity  Current medicines are reviewed with the patient today.  The patient does not have concerns regarding medicines other than what has been noted above.  The following changes have been made:  See above.  Labs/ tests ordered today include:    Orders Placed This Encounter  Procedures  . EKG 12-Lead     Disposition:   FU with me tentatively in one year unless his studies are abnormal.   Patient is agreeable to this plan and will call if any problems develop in the interim.   SignedTruitt Merle, NP  01/16/2018 9:27 AM  [facility] [address] [place], [place]  [address] Phone: [phone] Fax: [phone]

## 2018-01-16 NOTE — Patient Instructions (Addendum)
We will be checking the following labs today - BMET, HPF and lipids  If you have labs (blood work) drawn today and your tests are completely normal, you will receive your results only by: [name] MyChart Message (if you have MyChart) OR . A paper copy in the mail If you have any lab test that is abnormal or we need to change your treatment, we will call you to review the results.   Medication Instructions:    Continue with your current medicines.    If you need a refill on your cardiac medications before your next appointment, please call your pharmacy.     Testing/Procedures To Be Arranged:  Echocardiogram  Coronary CT  Follow-Up:   See me tentatively in one year - unless your studies are abnormal    At [facility], you and your health needs are our priority.  As part of our continuing mission to provide you with exceptional heart care, we have created designated Provider Care Teams.  These Care Teams include your primary Cardiologist (physician) and Advanced Practice Providers (APPs -  Physician Assistants and Nurse Practitioners) who all work together to provide you with the care you need, when you need it.  Special Instructions:  Please arrive at the [facility] main entrance of [facility] at _____________________________________________AM (30-45 minutes prior to test start time)  [facility] [place], [place] [address] [phone]  Proceed to the [facility] Radiology Department (First Floor).  Please follow these instructions carefully (unless otherwise directed):  Hold all erectile dysfunction medications at least 48 hours prior to test.  On the Night Before the Test: Be sure to Drink plenty of water. Do not consume any caffeinated/decaffeinated beverages or chocolate 12 hours prior to your test. Do not take any antihistamines 12 hours prior to your test.   On the Day of the Test: Drink plenty of water. Do not drink any  water within one hour of the test. Do not eat any food 4 hours prior to the test. You may take your regular medications prior to the test.  Take metoprolol (Lopressor) 100 mg (one time dose) two hours prior to test.  After the Test: Drink plenty of water. After receiving IV contrast, you may experience a mild flushed feeling. This is normal. On occasion, you may experience a mild rash up to 24 hours after the test. This is not dangerous. If this occurs, you can take Benadryl 25 mg and increase your fluid intake. If you experience trouble breathing, this can be serious. If it is severe call 911 IMMEDIATELY. If it is mild, please call our office.   Call the [place] office at [phone] if you have any questions, problems or concerns.

## 2018-01-17 ENCOUNTER — Other Ambulatory Visit: Payer: Self-pay

## 2018-01-17 ENCOUNTER — Ambulatory Visit (HOSPITAL_COMMUNITY): Payer: Medicare Other | Attending: Cardiovascular Disease

## 2018-01-17 DIAGNOSIS — Z8249 Family history of ischemic heart disease and other diseases of the circulatory system: Secondary | ICD-10-CM | POA: Insufficient documentation

## 2018-01-17 DIAGNOSIS — I10 Essential (primary) hypertension: Secondary | ICD-10-CM | POA: Diagnosis not present

## 2018-01-17 DIAGNOSIS — E7849 Other hyperlipidemia: Secondary | ICD-10-CM | POA: Insufficient documentation

## 2018-01-17 DIAGNOSIS — R9431 Abnormal electrocardiogram [ECG] [EKG]: Secondary | ICD-10-CM | POA: Diagnosis not present

## 2018-01-17 DIAGNOSIS — R011 Cardiac murmur, unspecified: Secondary | ICD-10-CM | POA: Diagnosis not present

## 2018-01-18 ENCOUNTER — Telehealth: Payer: Self-pay | Admitting: Nurse Practitioner

## 2018-01-18 NOTE — Telephone Encounter (Signed)
Follow up: ° ° °Patient returning call back °

## 2018-01-23 ENCOUNTER — Other Ambulatory Visit: Payer: Self-pay | Admitting: Chiropractic Medicine

## 2018-01-23 DIAGNOSIS — M5416 Radiculopathy, lumbar region: Secondary | ICD-10-CM

## 2018-01-31 DIAGNOSIS — M5136 Other intervertebral disc degeneration, lumbar region: Secondary | ICD-10-CM | POA: Diagnosis not present

## 2018-02-01 ENCOUNTER — Ambulatory Visit
Admission: RE | Admit: 2018-02-01 | Discharge: 2018-02-01 | Disposition: A | Discharge status: Home / Self Care | Payer: Medicare Other | Source: Ambulatory Visit | Attending: Chiropractic Medicine | Admitting: Chiropractic Medicine

## 2018-02-01 DIAGNOSIS — M48061 Spinal stenosis, lumbar region without neurogenic claudication: Secondary | ICD-10-CM | POA: Diagnosis not present

## 2018-02-01 DIAGNOSIS — M5416 Radiculopathy, lumbar region: Secondary | ICD-10-CM

## 2018-02-20 ENCOUNTER — Ambulatory Visit (HOSPITAL_COMMUNITY)
Admission: RE | Admit: 2018-02-20 | Discharge: 2018-02-20 | Disposition: A | Discharge status: Home / Self Care | Payer: Medicare Other | Source: Ambulatory Visit | Attending: Nurse Practitioner | Admitting: Nurse Practitioner

## 2018-02-20 ENCOUNTER — Other Ambulatory Visit: Payer: Self-pay | Admitting: Neurological Surgery

## 2018-02-20 DIAGNOSIS — E7849 Other hyperlipidemia: Secondary | ICD-10-CM | POA: Insufficient documentation

## 2018-02-20 DIAGNOSIS — I10 Essential (primary) hypertension: Secondary | ICD-10-CM | POA: Insufficient documentation

## 2018-02-20 DIAGNOSIS — R9431 Abnormal electrocardiogram [ECG] [EKG]: Secondary | ICD-10-CM

## 2018-02-20 DIAGNOSIS — Z8249 Family history of ischemic heart disease and other diseases of the circulatory system: Secondary | ICD-10-CM

## 2018-02-20 DIAGNOSIS — Z006 Encounter for examination for normal comparison and control in clinical research program: Secondary | ICD-10-CM

## 2018-02-20 DIAGNOSIS — M48061 Spinal stenosis, lumbar region without neurogenic claudication: Secondary | ICD-10-CM | POA: Diagnosis not present

## 2018-02-20 MED ORDER — METOPROLOL TARTRATE 5 MG/5ML IV SOLN
5.0000 mg | INTRAVENOUS | Status: DC | PRN
  Administered 2018-02-20: 5 mg via INTRAVENOUS
  Filled 2018-02-20 (×2): qty 5

## 2018-02-20 MED ORDER — METOPROLOL TARTRATE 5 MG/5ML IV SOLN
INTRAVENOUS | Status: AC
  Filled 2018-02-20: qty 15

## 2018-02-20 MED ORDER — NITROGLYCERIN 0.4 MG SL SUBL
0.8000 mg | SUBLINGUAL_TABLET | Freq: Once | SUBLINGUAL | Status: AC
  Administered 2018-02-20: 0.8 mg via SUBLINGUAL
  Filled 2018-02-20: qty 25

## 2018-02-20 MED ORDER — NITROGLYCERIN 0.4 MG SL SUBL
SUBLINGUAL_TABLET | SUBLINGUAL | Status: AC
  Filled 2018-02-20: qty 2

## 2018-02-20 MED ORDER — IOHEXOL 300 MG/ML  SOLN
100.0000 mL | Freq: Once | INTRAMUSCULAR | Status: AC | PRN
  Administered 2018-02-20: 100 mL via INTRAVENOUS

## 2018-02-20 MED ORDER — NITROGLYCERIN 0.4 MG SL SUBL
SUBLINGUAL_TABLET | SUBLINGUAL | Status: AC
  Filled 2018-02-20: qty 1

## 2018-02-20 NOTE — Research (Signed)
Nathan Collins met inclusion and exclusion criteria.  The informed consent form, study requirements and expectations were reviewed with the subject and questions and concerns were addressed prior to the signing of the consent form.  The subject verbalized understanding of the trial requirements.  The subject agreed to participate in the CADFEM trial and signed the informed consent.  The informed consent was obtained prior to performance of any protocol-specific procedures for the subject.  A copy of the signed informed consent was given to the subject and a copy was placed in the subject's medical record.   Dionne Bucy. [employer]

## 2018-02-20 NOTE — Progress Notes (Signed)
Nathan Collins met inclusion and exclusion criteria.  The informed consent form, study requirements and expectations were reviewed with the subject and questions and concerns were addressed prior to the signing of the consent form.  The subject verbalized understanding of the trial requirements.  The subject agreed to participate in the CADFEM trial and signed the informed consent.  The informed consent was obtained prior to performance of any protocol-specific procedures for the subject.  A copy of the signed informed consent was given to the subject and a copy was placed in the subject's medical record.   Dionne Bucy. [employer]

## 2018-02-23 MED ORDER — ATORVASTATIN CALCIUM 10 MG PO TABS: 10.0000 mg | ORAL_TABLET | Freq: Every day | ORAL | 3 refills | Status: DC

## 2018-02-23 NOTE — Telephone Encounter (Signed)
Pt's medication was sent to pt's pharmacy as requested. Confirmation received.  °

## 2018-02-27 ENCOUNTER — Ambulatory Visit (INDEPENDENT_AMBULATORY_CARE_PROVIDER_SITE_OTHER): Payer: Medicare Other | Admitting: Family Medicine

## 2018-02-27 ENCOUNTER — Encounter: Payer: Self-pay | Admitting: Family Medicine

## 2018-02-27 VITALS — BP 136/80 | HR 71 | Temp 98.8°F | Ht 67.0 in | Wt 211.4 lb

## 2018-02-27 DIAGNOSIS — I2510 Atherosclerotic heart disease of native coronary artery without angina pectoris: Secondary | ICD-10-CM | POA: Diagnosis not present

## 2018-02-27 DIAGNOSIS — M5440 Lumbago with sciatica, unspecified side: Secondary | ICD-10-CM

## 2018-02-27 DIAGNOSIS — Q250 Patent ductus arteriosus: Secondary | ICD-10-CM

## 2018-02-27 DIAGNOSIS — I10 Essential (primary) hypertension: Secondary | ICD-10-CM

## 2018-02-27 DIAGNOSIS — Z125 Encounter for screening for malignant neoplasm of prostate: Secondary | ICD-10-CM

## 2018-02-27 DIAGNOSIS — E785 Hyperlipidemia, unspecified: Secondary | ICD-10-CM | POA: Diagnosis not present

## 2018-02-27 DIAGNOSIS — M109 Gout, unspecified: Secondary | ICD-10-CM | POA: Diagnosis not present

## 2018-02-27 DIAGNOSIS — Z23 Encounter for immunization: Secondary | ICD-10-CM | POA: Diagnosis not present

## 2018-02-27 LAB — URIC ACID: URIC ACID, SERUM: 5.7 mg/dL (ref 4.0–7.8)

## 2018-02-27 LAB — PSA, MEDICARE: PSA: 1.52 ng/ml (ref 0.10–4.00)

## 2018-02-27 NOTE — Assessment & Plan Note (Signed)
At goal.  Continue losartan 100 mg daily. ?

## 2018-02-27 NOTE — Assessment & Plan Note (Signed)
Management per orthopedics. Has laminectomy scheduled.

## 2018-02-27 NOTE — Assessment & Plan Note (Signed)
Continue allopurinol 200 mg daily.  Check uric acid level.

## 2018-02-27 NOTE — Assessment & Plan Note (Signed)
Per cardiology. Continue statin.

## 2018-02-27 NOTE — Patient Instructions (Signed)
It was very nice to see you today!  I hope your back surgery goes well.  No medication changes today.  We will check blood work today.  Keep an eye on your BP and let me know if persistently 140/90 or higher.  Come back to see me in 1 year, or sooner as needed.   Take care, Dr Jerline Pain   Preventive Care 1 Years and Older, Male Preventive care refers to lifestyle choices and visits with your health care provider that can promote health and wellness. What does preventive care include?   A yearly physical exam. This is also called an annual well check.  Dental exams once or twice a year.  Routine eye exams. Ask your health care provider how often you should have your eyes checked.  Personal lifestyle choices, including: ? Daily care of your teeth and gums. ? Regular physical activity. ? Eating a healthy diet. ? Avoiding tobacco and drug use. ? Limiting alcohol use. ? Practicing safe sex. ? Taking low doses of aspirin every day. ? Taking vitamin and mineral supplements as recommended by your health care provider. What happens during an annual well check? The services and screenings done by your health care provider during your annual well check will depend on your age, overall health, lifestyle risk factors, and family history of disease. Counseling Your health care provider may ask you questions about your:  Alcohol use.  Tobacco use.  Drug use.  Emotional well-being.  Home and relationship well-being.  Sexual activity.  Eating habits.  History of falls.  Memory and ability to understand (cognition).  Work and work Statistician. Screening You may have the following tests or measurements:  Height, weight, and BMI.  Blood pressure.  Lipid and cholesterol levels. These may be checked every 5 years, or more frequently if you are over 42 years old.  Skin check.  Lung cancer screening. You may have this screening every year starting at age 69 if you have a  30-pack-year history of smoking and currently smoke or have quit within the past 15 years.  Colorectal cancer screening. All adults should have this screening starting at age 31 and continuing until age 33. You will have tests every 1-10 years, depending on your results and the type of screening test. People at increased risk should start screening at an earlier age. Screening tests may include: ? Guaiac-based fecal occult blood testing. ? Fecal immunochemical test (FIT). ? Stool DNA test. ? Virtual colonoscopy. ? Sigmoidoscopy. During this test, a flexible tube with a tiny camera (sigmoidoscope) is used to examine your rectum and lower colon. The sigmoidoscope is inserted through your anus into your rectum and lower colon. ? Colonoscopy. During this test, a long, thin, flexible tube with a tiny camera (colonoscope) is used to examine your entire colon and rectum.  Prostate cancer screening. Recommendations will vary depending on your family history and other risks.  Hepatitis C blood test.  Hepatitis B blood test.  Sexually transmitted disease (STD) testing.  Diabetes screening. This is done by checking your blood sugar (glucose) after you have not eaten for a while (fasting). You may have this done every 1-3 years.  Abdominal aortic aneurysm (AAA) screening. You may need this if you are a current or former smoker.  Osteoporosis. You may be screened starting at age 66 if you are at high risk. Talk with your health care provider about your test results, treatment options, and if necessary, the need for more tests. Vaccines Your  health care provider may recommend certain vaccines, such as:  Influenza vaccine. This is recommended every year.  Tetanus, diphtheria, and acellular pertussis (Tdap, Td) vaccine. You may need a Td booster every 10 years.  Varicella vaccine. You may need this if you have not been vaccinated.  Zoster vaccine. You may need this after age 57.  Measles, mumps,  and rubella (MMR) vaccine. You may need at least one dose of MMR if you were born in 1957 or later. You may also need a second dose.  Pneumococcal 13-valent conjugate (PCV13) vaccine. One dose is recommended after age 23.  Pneumococcal polysaccharide (PPSV23) vaccine. One dose is recommended after age 23.  Meningococcal vaccine. You may need this if you have certain conditions.  Hepatitis A vaccine. You may need this if you have certain conditions or if you travel or work in places where you may be exposed to hepatitis A.  Hepatitis B vaccine. You may need this if you have certain conditions or if you travel or work in places where you may be exposed to hepatitis B.  Haemophilus influenzae type b (Hib) vaccine. You may need this if you have certain risk factors. Talk to your health care provider about which screenings and vaccines you need and how often you need them. This information is not intended to replace advice given to you by your health care provider. Make sure you discuss any questions you have with your health care provider. Document Released: 03/07/2015 Document Revised: 03/31/2017 Document Reviewed: 12/10/2014 Elsevier Interactive Patient Education  2019 [employer].

## 2018-02-27 NOTE — Assessment & Plan Note (Signed)
Per cardiology 

## 2018-02-27 NOTE — Assessment & Plan Note (Addendum)
Continue atorvastatin 10 mg daily.  Last LDL 100.

## 2018-02-27 NOTE — Progress Notes (Signed)
   Subjective:  Nathan Collins is a 68 y.o. male who presents today with a chief complaint of low back pain with sciatica.   HPI:  Low Back Pain with sciatica, new problem to provider.  A couple of months ago.  Had severe pain radiating down his right leg.  Has since been seen by orthopedics and was diagnosed with sciatica.  He had an MRI which revealed spinal canal stenosis and several bulging disc.  He has laminectomy scheduled in 3 weeks.  Symptoms are currently controlled on ibuprofen and prednisone.  He was previously on hydrocodone but is no longer taking it.  PDA / CAD Patient is also seen cardiology and started last visit.  He underwent echo cardiogram and CT chest which showed PDA.  His calcium score was at the 53rd percentile for age and sex.  His other stable, chronic medical conditions are outlined below:  #Gout - On allopurinol 200mg  daily and tolerating well.  #HTN - On losartan 100mg  daily and tolerating well  ROS: Per HPI  PMH: He reports that he has never smoked. He has never used smokeless tobacco. He reports previous alcohol use. He reports that he does not use drugs.  Objective:  Physical Exam: BP 136/80 (BP Location: Left Arm, Patient Position: Sitting, Cuff Size: Large)   Pulse 71   Temp 98.8 F (37.1 C) (Oral)   Ht 5\' 7"  (1.702 m)   Wt 211 lb 6.1 oz (95.9 kg)   SpO2 99%   BMI 33.11 kg/m   Gen: NAD, resting comfortably CV: RRR with no murmurs appreciated Pulm: NWOB, CTAB with no crackles, wheezes, or rhonchi GI: Normal bowel sounds present. Soft, Nontender, Nondistended. MSK: No edema, cyanosis, or clubbing noted Skin: Warm, dry Neuro: Grossly normal, moves all extremities Psych: Normal affect and thought content  Assessment/Plan:  Low back pain with sciatica Management per orthopedics. Has laminectomy scheduled.   CAD (coronary artery disease) Per cardiology. Continue statin.   PDA with systlic heart murmur Per cardiology.    Gout Continue allopurinol 200 mg daily.  Check uric acid level.  Essential hypertension At goal.  Continue losartan 100 mg daily.  Dyslipidemia Continue atorvastatin 10 mg daily.  Last LDL 100.  Preventive health care Check PSA.  Flu shot given today.  Up-to-date on vaccines and screenings.  Follow up with me in 6-12 months.   Algis Greenhouse. Jerline Pain, MD 02/27/2018 9:33 AM

## 2018-02-28 NOTE — Progress Notes (Signed)
Dr Marigene Ehlers interpretation of your lab work:  Good news! Your uric acid and PSA levels are both normal.   We can recheck in about a year or so.   If you have any additional questions, please give [place] a call or send [place] a message through [place].  Take care, Dr Jerline Pain

## 2018-03-06 ENCOUNTER — Encounter: Payer: Self-pay | Admitting: Nurse Practitioner

## 2018-03-06 ENCOUNTER — Ambulatory Visit (INDEPENDENT_AMBULATORY_CARE_PROVIDER_SITE_OTHER): Payer: Medicare Other | Admitting: Nurse Practitioner

## 2018-03-06 VITALS — BP 150/82 | HR 73 | Ht 67.0 in | Wt 215.8 lb

## 2018-03-06 DIAGNOSIS — I259 Chronic ischemic heart disease, unspecified: Secondary | ICD-10-CM

## 2018-03-06 DIAGNOSIS — I10 Essential (primary) hypertension: Secondary | ICD-10-CM

## 2018-03-06 DIAGNOSIS — E7849 Other hyperlipidemia: Secondary | ICD-10-CM | POA: Diagnosis not present

## 2018-03-06 MED ORDER — ATORVASTATIN CALCIUM 20 MG PO TABS: 20.0000 mg | ORAL_TABLET | Freq: Every day | ORAL | 3 refills | Status: DC

## 2018-03-06 NOTE — Patient Instructions (Addendum)
We will be checking the following labs today - NONE    Medication Instructions:    Continue with your current medicines. BUT  I am increasing your Lipitor to 20 mg a day - this has been sent to your pharmacy.   Start baby aspirin when ok with Dr. Ronnald Ramp   If you need a refill on your cardiac medications before your next appointment, please call your pharmacy.     Testing/Procedures To Be Arranged:  N/A  Follow-Up:   See me in about 3 to 4 months with fasting labs    At [facility], you and your health needs are our priority.  As part of our continuing mission to provide you with exceptional heart care, we have created designated Provider Care Teams.  These Care Teams include your primary Cardiologist (physician) and Advanced Practice Providers (APPs -  Physician Assistants and Nurse Practitioners) who all work together to provide you with the care you need, when you need it.  Special Instructions:                              Ascending Aortic Aneurysm/ Thoracic Aortic Aneurysm   Recent studies have raised concern that fluoroquinolone antibiotics could be associated with an increased risk of aortic aneurysm or aortic dissection. You should avoid use of Cipro and other associated antibiotics (flouroquinolone antibiotics )  It is  best to avoid activities that cause grunting or straining (medically referred to as a "valsalva maneuver"). This happens when a person bears down against a closed throat to increase the strength of arm or abdominal muscles. There's often a tendency to do this when lifting heavy weights, doing sit-ups, push-ups or chin-ups, etc., but it may be harmful.     An aneurysm is a bulge in an artery. It happens when blood pushes up against a weakened or damaged artery wall. A thoracic aortic aneurysm is an aneurysm that occurs in the first part of the aorta, between the heart and the diaphragm. The aorta is the main artery of the body. It supplies blood from  the heart to the rest of the body. Some aneurysms may not cause symptoms or problems. However, the major concern with a thoracic aortic aneurysm is that it can enlarge and burst (rupture), or blood can flow between the layers of the wall of the aorta through a tear (aorticdissection). Both of these conditions can cause bleeding inside the body and can be life-threatening if they are not diagnosed and treated right away. What are the causes? The exact cause of this condition is not known. What increases the risk? The following factors may make you more likely to develop this condition: Being age 86 or older. Having a hardening of the arteries caused by the buildup of fat and other substances in the lining of a blood vessel (arteriosclerosis). Having inflammation of the walls of an artery (arteritis). Having a genetic disease that weakens the body's connective tissue, such as Marfan syndrome. Having an injury or trauma to the aorta. Having an infection that is caused by bacteria, such as syphilis or staphylococcus, in the wall of the aorta (infectious aortitis). Having high blood pressure (hypertension). Being male. Being white (Caucasian). Having high cholesterol. Having a family history of aneurysms. Using tobacco. Having chronic obstructive pulmonary disease (COPD). What are the signs or symptoms? Symptoms of this condition vary depending on the size and rate of growth of the aneurysm. Most grow slowly  and do not cause any symptoms. When symptoms do occur, they may include: Pain in the chest, back, sides, or abdomen. The pain may vary in intensity. A sudden onset of severe pain may indicate that the aneurysm has ruptured. Hoarseness. Cough. Shortness of breath. Swallowing problems. Swelling in the face, arms, or legs. Fever. Unexplained weight loss. How is this diagnosed? This condition may be diagnosed with: An ultrasound. X-rays. A CT scan. An MRI. Tests to check the arteries  for damage or blockages (angiogram). Most unruptured thoracic aortic aneurysms cause no symptoms, so they are often found during exams for other conditions. How is this treated? Treatment for this condition depends on: The size of the aneurysm. How fast the aneurysm is growing. Your age. Risk factors for rupture. Aneurysms that are smaller than 2.2 inches (5.5 cm) may be managed by using medicines to control blood pressure, manage pain, or fight infection. You may need regular monitoring to see if the aneurysm is getting bigger. Your health care provider may recommend that you have an ultrasound every year or every 6 months. How often you need to have an ultrasound depends on the size of the aneurysm, how fast it is growing, and whether you have a family history of aneurysms. Surgical repair may be needed if your aneurysm is larger than 2.2 inches or if it is growing quickly. Follow these instructions at home: Eating and drinking  Eat a healthy diet. Your health care provider may recommend that you: Lower your salt (sodium) intake. In some people, too much salt can raise blood pressure and increase the risk of thoracic aortic aneurysm. Avoid foods that are high in saturated fat and cholesterol, such as red meat and dairy. Eat a diet that is low in sugar. Increase your fiber intake by including whole grains, vegetables, and fruits in your diet. Eating these foods may help to lower blood pressure. Limit or avoid alcohol as recommended by your health care provider. Lifestyle  Follow instructions from your health care provider about healthy lifestyle habits. Your health care provider may recommend that you: Do not use any products that contain nicotine or tobacco, such as cigarettes and e-cigarettes. If you need help quitting, ask your health care provider. Keep your blood pressure within normal limits. The target limit for most people is below 120/80. Check your blood pressure regularly. If it is  high, ask your health care provider about ways that you can control it. Keep your blood sugar (glucose) level and cholesterol levels within normal limits. Target limits for most people are: Blood glucose level: Less than 100 mg/dL. Total cholesterol level: Less than 200 mg/dL. Maintain a healthy weight. Activity  Stay physically active and exercise regularly. Talk with your health care provider about how often you should exercise and ask which types of exercise are safe for you. Avoid heavy lifting and activities that take a lot of effort (are strenuous). Ask your health care provider what activities are safe for you. General instructions  Keep all follow-up visits as told by your health care provider. This is important. Talk with your health care provider about regular screenings to see if the aneurysm is getting bigger. Take over-the-counter and prescription medicines only as told by your health care provider. Contact a health care provider if: You have discomfort in your upper back, neck, or abdomen. You have trouble swallowing. You have a cough or hoarseness. You have a family history of aneurysms. You have unexplained weight loss. Get help right away if:  You have sudden, severe pain in your upper back and abdomen. This pain may move into your chest and arms. You have shortness of breath. You have a fever. This information is not intended to replace advice given to you by your health care provider. Make sure you discuss any questions you have with your health care provider. Document Released: 02/08/2005 Document Revised: 11/21/2015 Document Reviewed: 11/21/2015 Elsevier Interactive Patient Education  2017 [employer].   Aortic Dissection An aortic dissection happens when there is a tear in the main blood vessel of the body (aorta). The aorta comes out of the heart, curves around, and then goes down the chest (thoracic aorta) and into the abdomen (abdominal aorta) to supply arteries  with blood. The wall of the aorta has inner and outer layers. Aortic dissection occurs most often in the thoracic aorta. As the tear widens and blood flows through it, the aorta becomes "double-barreled." This means that one part of the aorta continues to carry blood to the body, but blood also flows into the tear, between the layers of the aorta. The torn part of the aorta fills with blood and swells up. This can reduce blood flow through the part of the aorta that is still supplying blood to the body. Aortic dissection is a medical emergency. What are the causes? An aortic dissection is commonly caused by weakening of the artery wall due to high blood pressure. Other causes may include: An injury, such as from a car crash. Birth defects that affect the heart (congenital heart defects). Thickening of the artery walls. In some cases, the cause is not known. What increases the risk? The following factors may make you more likely to develop this condition: Having certain medical conditions, such as: High blood pressure (hypertension). Hardening and narrowing of the arteries (atherosclerosis). A genetic disorder that affects the connective tissue, such as Marfan syndrome or Ehlers-Danlos syndrome. A condition that causes inflammation of blood vessels, such as giant cell arteritis. Having a chest injury. Having surgery on the aorta. Being born with a congenital heart defect. Being male. Being older than age 73. Using cocaine. Smoking. Lifting heavy weights or doing other types of high-intensity resistance training. What are the signs or symptoms? Signs and symptoms of aortic dissection start suddenly. The most common symptoms are: Severe chest pain that may feel like tearing, stabbing, or sharp pain. Severe pain that spreads (radiates) to the back, neck, jaw, or abdomen. Other symptoms may include: Trouble breathing. Dizziness or fainting. Sudden weakness on one side of the body. Nausea or  vomiting. Trouble swallowing. Coughing up blood. Vomiting blood. Clammy skin. How is this diagnosed? This condition may be diagnosed based on: Your symptoms. A physical exam. This may include: Listening for abnormal blood flow sounds (murmurs) in your chest or abdomen. Checking your pulse in your arms and legs. Checking your blood pressure to see whether it is low or whether there is a difference between the measurements in your right and left arm. Electrocardiogram (ECG). This test measures the electrical activity in your heart. Chest X-ray. CT scan. MRI. Aortic angiogram. This test involves injecting dye to make it easier to see your blood vessels clearly. Echocardiogram to study your heart using sound waves. Blood tests. How is this treated? It is important to treat an aortic dissection as quickly as possible. Treatment may start as soon as your health care provider thinks that you have aortic dissection. Treatment depends on the location and severity of your dissection and your  overall health. Treatment may include: Medicines to lower your blood pressure. Surgery to repair the dissected part of your aorta with artificial material (syntheticgraft). A medical procedure to insert a stent-graft into the aorta (endovascular procedure). During this procedure, a long, thin tube (stent) is inserted into an artery near the groin (femoral artery) and moved up to the damaged part of the aorta. Then, the stent is opened to help improve blood flow and prevent future dissection. Follow these instructions at home: Activity  Avoid activities that could injure your chest or your abdomen. Ask your health care provider what activities are safe for you. After you have recovered, try to stay active. Ask your health care provider what activities are safe for you after recovery. Do not lift anything that is heavier than 10 lb (4.5 kg) until your health care provider approves. Do not drive or use heavy  machinery while taking prescription pain medicine. Eating and drinking  Eat a heart-healthy diet, which includes lots of fresh fruits and vegetables, low-fat (lean) protein, and whole grains. Check ingredients and nutrition facts on packaged foods and beverages, and avoid foods with high amounts of: Salt (sodium). Saturated fats (like red meat). Trans fats (like fried food). General instructions  Take over-the-counter and prescription medicines only as told by your health care provider. Work with your health care provider to manage your blood pressure. Talk with your health care provider about how to manage stress. Do not use any products that contain nicotine or tobacco, such as cigarettes and e-cigarettes. If you need help quitting, ask your health care provider. Keep all follow-up visits as told by your health care provider. This is important. Get help right away if: You develop any symptoms of aortic dissection after treatment, including severe pain in your chest, back, or abdomen. You have a pain in your abdomen. You have trouble breathing or you develop a cough. You faint. You develop a racing heartbeat. These symptoms may represent a serious problem that is an emergency. Do not wait to see if the symptoms will go away. Get medical help right away. Call your local emergency services (911 in the [place] Do not drive yourself to the hospital. Summary An aortic dissection happens when there is a tear in the main blood vessel of the body (aorta). It is a medical emergency. The most common symptom is severe pain in the chest that spreads (radiates) to the back, neck, jaw, or abdomen. It is important to treat an aortic dissection as quickly as possible. Treatment typically includes surgery and medicines. This information is not intended to replace advice given to you by your health care provider. Make sure you discuss any questions you have with your health care provider. Document Released:  05/18/2007 Document Revised: 12/29/2015 Document Reviewed: 12/29/2015 Elsevier Interactive Patient Education  2017 [employer]. .  .   Call the [place] office at [phone] if you have any questions, problems or concerns.

## 2018-03-06 NOTE — Progress Notes (Signed)
CARDIOLOGY OFFICE NOTE  Date:  03/06/2018    Nathan Collins Date of Birth: 03-05-1950 Medical Record #[id number]  PCP:  Vivi Barrack, MD  Cardiologist:  Servando Snare     Chief Complaint  Patient presents with  . Follow-up    Follow up to discuss coronary CT.     History of Present Illness: Nathan Collins is a 68 y.o. male who presents today for a follow up visit. I see his wife - [name]. Dr. Radford Pax is the DOD today.   He has a history of HTN, gout, HLD, OSA and history of melanoma.   I saw him as a new patient back in November - his wife had wanted him "checked out" due to multiple risk factors. He has +FH for CAD. Both parents developed heart disease in their 37's - dad had CHF/MI/multiple stents. Mom with PAD/CHF - she smoked. He has never smoked. He is on CPAP for OSA.   I elected to get a coronary CT - see below.   Comes in today. Here with   Past Medical History:  Diagnosis Date  . Arthritis   . Cancer (HCC)    HX OF MALIGNANT MELANOMA SHOULDER   . Hypertension   . Sleep apnea    CPAP- 4 GOES TO 12 OR 14     Past Surgical History:  Procedure Laterality Date  . APPENDECTOMY    . JOINT REPLACEMENT     RIGHT KNEE REPLACEMENT   . KNEE ARTHROSCOPY     LEFT   . RIGHT KNEE SURGERY     . TOTAL KNEE ARTHROPLASTY  12/10/2011   Procedure: TOTAL KNEE ARTHROPLASTY;  Surgeon: [name], MD;  Location: [facility];  Service: Orthopedics;  Laterality: Left;     Medications: Current Meds  Medication Sig  . allopurinol (ZYLOPRIM) 100 MG tablet Take 2 tablets (200 mg total) by mouth every morning.  . Coenzyme Q10 (CO Q 10 PO) Take 200 mg by mouth daily.   . colchicine 0.6 MG tablet Take 0.6 mg by mouth daily as needed (gout).  [name] ibuprofen (ADVIL,MOTRIN) 200 MG tablet Take 800 mg by mouth every 4 (four) hours as needed for moderate pain.  [name] losartan (COZAAR) 100 MG tablet Take 1 tablet (100 mg total) by mouth every morning.  . Multiple Vitamin (MULTIVITAMIN)  tablet Take 1 tablet by mouth daily.  . sildenafil (REVATIO) 20 MG tablet Take 100 mg by mouth daily as needed (ED).  . [DISCONTINUED] atorvastatin (LIPITOR) 10 MG tablet Take 1 tablet (10 mg total) by mouth daily at 6 PM.  . [DISCONTINUED] metoprolol tartrate (LOPRESSOR) 100 MG tablet Take 1 tablet (100 mg total) by mouth as directed. Take 2 hours prior to CT scan     Allergies: Allergies  Allergen Reactions  . Indocin [Indomethacin] Hives    No reaction to other NSAIDs.     Social History: The patient  reports that he has never smoked. He has never used smokeless tobacco. He reports previous alcohol use. He reports that he does not use drugs.   Family History: The patient's family history includes Arrhythmia in his mother; Bladder Cancer in his mother; Diabetes in his father; Heart failure in his father and mother; Hyperlipidemia in his mother; Peripheral Artery Disease in his mother; Prostate cancer in his father.   Review of Systems: Please see the history of present illness.   Otherwise, the review of systems is positive for none.   All other systems  are reviewed and negative.   Physical Exam: VS:  BP (!) 150/82 (BP Location: Left Arm, Patient Position: Sitting, Cuff Size: Normal)   Pulse 73   Ht 5\' 7"  (1.702 m)   Wt 215 lb 12.8 oz (97.9 kg)   SpO2 (!) 62%   BMI 33.80 kg/m  .  BMI Body mass index is 33.8 kg/m.  Wt Readings from Last 3 Encounters:  03/06/18 215 lb 12.8 oz (97.9 kg)  02/27/18 211 lb 6.1 oz (95.9 kg)  01/16/18 209 lb (94.8 kg)    General: Pleasant. Well developed, well nourished and in no acute distress.   HEENT: Normal.  Neck: Supple, no JVD, carotid bruits, or masses noted.  Cardiac: Regular rate and rhythm. No murmurs, rubs, or gallops. No edema.  Respiratory:  Lungs are clear to auscultation bilaterally with normal work of breathing.  GI: Soft and nontender.  MS: No deformity or atrophy. Gait and ROM intact.  Skin: Warm and dry. Color is normal.    Neuro:  Strength and sensation are intact and no gross focal deficits noted.  Psych: Alert, appropriate and with normal affect.   LABORATORY DATA:  EKG:  EKG is not ordered today.  Lab Results  Component Value Date   WBC 6.3 11/14/2016   HGB 14.9 11/14/2016   HCT 44 11/14/2016   PLT 177 11/14/2016   GLUCOSE 104 (H) 01/16/2018   CHOL 177 01/16/2018   TRIG 163 (H) 01/16/2018   HDL 44 01/16/2018   LDLCALC 100 (H) 01/16/2018   ALT 34 01/16/2018   AST 19 01/16/2018   NA 139 01/16/2018   K 4.3 01/16/2018   CL 98 01/16/2018   CREATININE 1.34 (H) 01/16/2018   BUN 27 01/16/2018   CO2 23 01/16/2018   PSA 1.52 02/27/2018   INR 0.97 12/07/2011   HGBA1C 5.9 05/11/2017     BNP (last 3 results) No results for input(s): BNP in the last 8760 hours.  ProBNP (last 3 results) No results for input(s): PROBNP in the last 8760 hours.   Other Studies Reviewed Today:  Coronary CT 01/2018 FINDINGS: Non-cardiac: See separate report from [facility] Radiology. No significant findings on limited lung and soft tissue windows.  Calcium score: Calcium noted in proximal LAD/circumflex and D1  Coronary Arteries: Left  dominant with no anomalies  LM: Normal  LAD: Less than 30% calcific plaque proximally 50% calcific plaque prior to take-off of D2  D1: 50% or less calcific stenosis  D2: Less than 50% calcific plaque  Circumflex: Less than 30% calcific stenosis proximally  OM1: Normal  OM2: Normal  PDA: Normal  PLA Normal  RCA: Normal  IMPRESSION: 1.  Calcium score 137 which is 53 rd percentile for age and sex  2.  Mild aortic root dilatation 4.0 cm  3. CAD worst lesion in mid LAD prior to D2 take off 50% Study will be sent for FFR CT  Jenkins Rouge   Electronically Signed   By: Jenkins Rouge M.D.   On: 02/20/2018 13:39  FFR FINDINGS: FFR CT is normal in the RCA, LM, LAD. D2 is normal at.90 The most distal aspect of OM1 is abnormal at.73. The  most distal aspect of the left sided  PDA is abnormal at.78  IMPRESSION: FFR CT is abnormal in the most distal aspect of OM1 and distal left sided PDA. Would consider medical Rx initially as abnormalities only in the most Distal aspect of small vessels   Electronically Signed   By: Collier Salina  Johnsie Cancel M.D.   On: 02/21/2018 08:03  Echo Study Conclusions 12/2017  - Left ventricle: The cavity size was normal. There was mild   concentric hypertrophy. Systolic function was normal. The   estimated ejection fraction was in the range of 60% to 65%. Wall   motion was normal; there were no regional wall motion   abnormalities. Doppler parameters are consistent with abnormal   left ventricular relaxation (grade 1 diastolic dysfunction).   Doppler parameters are consistent with indeterminate ventricular   filling pressure. - Aortic valve: Transvalvular velocity was within the normal range.   There was no stenosis. There was no regurgitation. - Aorta: Ascending aortic diameter: 41 mm (S). - Ascending aorta: The ascending aorta was mildly dilated. - Mitral valve: Transvalvular velocity was within the normal range.   There was no evidence for stenosis. There was no regurgitation. - Left atrium: The atrium was mildly dilated. - Right ventricle: The cavity size was normal. Wall thickness was   normal. Systolic function was normal. - Pulmonary arteries: Systolic pressure was within the normal   range. - Global longitudinal strain -19.3% (normal).  Assessment/Plan:  1. Abnormal coronary CT - he has CAD - primarily in the branches of the LAD - FFR was abnormal - but being in the distal areas - medical therapy was recommended - he is an acceptable candidate for his upcoming back surgery with Dr. Ronnald Ramp. Discussed with Dr. Irish Lack (DOD) who is in agreement. Would like to increase his statin - goal LDL is now less than 70. Would start baby aspirin when ok with neurosurgery. I will see him in about 3  to 4 months. CV risk factor modification will be imperative going forward.   2. HTN - BP not at goal - has considerable pain - will follow but low threshold to add additional agents.   3. HLD - increasing his statin today. Lab on return.   4. +FH for CAD  5. Dilated aorta - will need annual imaging. Precautions given.   Current medicines are reviewed with the patient today.  The patient does not have concerns regarding medicines other than what has been noted above.  The following changes have been made:  See above.  Labs/ tests ordered today include:   No orders of the defined types were placed in this encounter.    Disposition:   FU with me in about 3 to 4 months with fasting labs.    Patient is agreeable to this plan and will call if any problems develop in the interim.   SignedTruitt Merle, NP  03/06/2018 10:51 AM  [facility] [address] [place], [place]  [address] Phone: [phone] Fax: [phone]

## 2018-03-10 MED ORDER — ATORVASTATIN CALCIUM 20 MG PO TABS: 20.0000 mg | ORAL_TABLET | Freq: Every day | ORAL | 3 refills | Status: DC

## 2018-03-15 NOTE — Pre-Procedure Instructions (Signed)
Nathan Collins  03/15/2018      [id number] [place], [place] [address] [place] [address] Phone: [phone] Fax: [phone]  [facility] # [address], [place] [address] [place] [address] Phone: [phone] Fax: [phone]    Your procedure is scheduled on January 30  Report to [facility] at 1000 A.M.  Call this number if you have problems the morning of surgery:  [phone]   Remember:  Do not eat or drink after midnight.      Take these medicines the morning of surgery with A SIP OF WATER  allopurinol (ZYLOPRIM) Colchicine  7 days prior to surgery STOP taking any Aspirin (unless otherwise instructed by your surgeon), Aleve, Naproxen, Ibuprofen, Motrin, Advil, Goody's, BC's, all herbal medications, fish oil, and all vitamins.     Do not wear jewelry  Do not wear lotions, powders, or cologne, or deodorant.  Men may shave face and neck.  Do not bring valuables to the hospital.  [facility] is not responsible for any belongings or valuables.  Contacts, dentures or bridgework may not be worn into surgery.  Leave your suitcase in the car.  After surgery it may be brought to your room.  For patients admitted to the hospital, discharge time will be determined by your treatment team.  Patients discharged the day of surgery will not be allowed to drive home.    Special instructions:   [facility]- Preparing For Surgery  Before surgery, you can play an important role. Because skin is not sterile, your skin needs to be as free of germs as possible. You can reduce the number of germs on your skin by washing with CHG (chlorahexidine gluconate) Soap before surgery.  CHG is an antiseptic cleaner which kills germs and bonds with the skin to continue killing germs even after washing.    Oral Hygiene is also important to reduce your risk of infection.  Remember - BRUSH YOUR  TEETH THE MORNING OF SURGERY WITH YOUR REGULAR TOOTHPASTE  Please do not use if you have an allergy to CHG or antibacterial soaps. If your skin becomes reddened/irritated stop using the CHG.  Do not shave (including legs and underarms) for at least 48 hours prior to first CHG shower. It is OK to shave your face.  Please follow these instructions carefully.   1. Shower the NIGHT BEFORE SURGERY and the MORNING OF SURGERY with CHG.   2. If you chose to wash your hair, wash your hair first as usual with your normal shampoo.  3. After you shampoo, rinse your hair and body thoroughly to remove the shampoo.  4. Use CHG as you would any other liquid soap. You can apply CHG directly to the skin and wash gently with a scrungie or a clean washcloth.   5. Apply the CHG Soap to your body ONLY FROM THE NECK DOWN.  Do not use on open wounds or open sores. Avoid contact with your eyes, ears, mouth and genitals (private parts). Wash Face and genitals (private parts)  with your normal soap.  6. Wash thoroughly, paying special attention to the area where your surgery will be performed.  7. Thoroughly rinse your body with warm water from the neck down.  8. DO NOT shower/wash with your normal soap after using and rinsing off the CHG Soap.  9. Pat yourself dry with a CLEAN TOWEL.  10. Wear  CLEAN PAJAMAS to bed the night before surgery, wear comfortable clothes the morning of surgery  11. Place CLEAN SHEETS on your bed the night of your first shower and DO NOT SLEEP WITH PETS.    Day of Surgery:  Do not apply any deodorants/lotions.  Please wear clean clothes to the hospital/surgery center.   Remember to brush your teeth WITH YOUR REGULAR TOOTHPASTE.    Please read over the following fact sheets that you were given.

## 2018-03-16 ENCOUNTER — Encounter (HOSPITAL_COMMUNITY)
Admission: RE | Admit: 2018-03-16 | Discharge: 2018-03-16 | Disposition: A | Discharge status: Home / Self Care | Payer: Medicare Other | Source: Ambulatory Visit | Attending: Neurological Surgery | Admitting: Neurological Surgery

## 2018-03-16 ENCOUNTER — Encounter (HOSPITAL_COMMUNITY): Payer: Self-pay

## 2018-03-16 ENCOUNTER — Other Ambulatory Visit: Payer: Self-pay

## 2018-03-16 DIAGNOSIS — Z01812 Encounter for preprocedural laboratory examination: Secondary | ICD-10-CM | POA: Insufficient documentation

## 2018-03-16 HISTORY — DX: Thoracic aortic aneurysm, without rupture: I71.2

## 2018-03-16 HISTORY — DX: Cardiac murmur, unspecified: R01.1

## 2018-03-16 HISTORY — DX: Atherosclerotic heart disease of native coronary artery without angina pectoris: I25.10

## 2018-03-16 HISTORY — DX: Aneurysm of the ascending aorta, without rupture: I71.21

## 2018-03-16 HISTORY — DX: Personal history of urinary calculi: Z87.442

## 2018-03-16 LAB — CBC
HCT: 39.9 % (ref 39.0–52.0)
Hemoglobin: 13.6 g/dL (ref 13.0–17.0)
MCH: 30 pg (ref 26.0–34.0)
MCHC: 34.1 g/dL (ref 30.0–36.0)
MCV: 87.9 fL (ref 80.0–100.0)
Platelets: 271 10*3/uL (ref 150–400)
RBC: 4.54 MIL/uL (ref 4.22–5.81)
RDW: 13.5 % (ref 11.5–15.5)
WBC: 7 10*3/uL (ref 4.0–10.5)
nRBC: 0 % (ref 0.0–0.2)

## 2018-03-16 LAB — SURGICAL PCR SCREEN
MRSA, PCR: NEGATIVE
Staphylococcus aureus: NEGATIVE

## 2018-03-16 LAB — BASIC METABOLIC PANEL
Anion gap: 10 (ref 5–15)
BUN: 25 mg/dL — ABNORMAL HIGH (ref 8–23)
CO2: 20 mmol/L — ABNORMAL LOW (ref 22–32)
Calcium: 9 mg/dL (ref 8.9–10.3)
Chloride: 111 mmol/L (ref 98–111)
Creatinine, Ser: 1.23 mg/dL (ref 0.61–1.24)
GFR calc Af Amer: >60 mL/min (ref >60)
GFR calc non Af Amer: 60 mL/min — ABNORMAL LOW (ref >60)
Glucose, Bld: 168 mg/dL — ABNORMAL HIGH (ref 70–99)
POTASSIUM: 3.8 mmol/L (ref 3.5–5.1)
Sodium: 141 mmol/L (ref 135–145)

## 2018-03-16 NOTE — Pre-Procedure Instructions (Signed)
Nathan Collins  03/16/2018      [id number] [place], [place] [address] [place] [address] Phone: [phone] Fax: [phone]  [facility] # [address] [place] [address] [place] [address] Phone: [phone] Fax: [phone]    Your procedure is scheduled on, Thursday January 30   Report to [facility] Admitting at 1000 A.M.             (posted surgery time 11:58a - 1:54p)   Call this number if you have problems the morning of surgery:  [phone]   Remember:  Do not eat any foods or drink any liquids after midnight, Wednesday.      Take these medicines the morning of surgery with A SIP OF WATER  allopurinol (ZYLOPRIM) Colchicine  7 days prior to surgery STOP taking any Aspirin (unless otherwise instructed by your surgeon), Aleve, Naproxen, Ibuprofen, Motrin, Advil, Goody's, BC's, all herbal medications, fish oil, and all vitamins.     Do not wear jewelry  Do not wear lotions,  cologne, or deodorant.  Men may shave face and neck.  Do not bring valuables to the hospital.  [facility] is not responsible for any belongings or valuables.  Contacts, dentures or bridgework may not be worn into surgery.  Leave your suitcase in the car.  After surgery it may be brought to your room.  For patients admitted to the hospital, discharge time will be determined by your treatment team.     Special instructions:   [facility]- Preparing For Surgery  Before surgery, you can play an important role. Because skin is not sterile, your skin needs to be as free of germs as possible. You can reduce the number of germs on your skin by washing with CHG (chlorahexidine gluconate) Soap before surgery.  CHG is an antiseptic cleaner which kills germs and bonds with the skin to continue killing germs even after washing.    Oral Hygiene is also important to reduce your risk of infection.   Remember - BRUSH YOUR TEETH THE MORNING OF SURGERY WITH YOUR REGULAR TOOTHPASTE  Please do not use if you have an allergy to CHG or antibacterial soaps. If your skin becomes reddened/irritated stop using the CHG.  Do not shave (including legs and underarms) for at least 48 hours prior to first CHG shower. It is OK to shave your face.  Please follow these instructions carefully.   1. Shower the NIGHT BEFORE SURGERY and the MORNING OF SURGERY with CHG.   2. If you chose to wash your hair, wash your hair first as usual with your normal shampoo.  3. After you shampoo, rinse your hair and body thoroughly to remove the shampoo.  4. Use CHG as you would any other liquid soap. You can apply CHG directly to the skin and wash gently with a scrungie or a clean washcloth.   5. Apply the CHG Soap to your body ONLY FROM THE NECK DOWN.  Do not use on open wounds or open sores. Avoid contact with your eyes, ears, mouth and genitals (private parts). Wash Face and genitals (private parts)  with your normal soap.  6. Wash thoroughly, paying special attention to the area where your surgery will be performed.  7. Thoroughly rinse your body with warm water from the neck down.  8. DO NOT shower/wash with your normal soap after using and rinsing off the CHG Soap.  9. Pat yourself dry with a CLEAN TOWEL.  10. Wear CLEAN PAJAMAS to bed the night before surgery, wear comfortable clothes the morning of surgery  11. Place CLEAN SHEETS on your bed the night of your first shower and DO NOT SLEEP WITH PETS.  Day of Surgery:  Do not apply any deodorants/lotions.  Please wear clean clothes to the hospital/surgery center.   Remember to brush your teeth WITH YOUR REGULAR TOOTHPASTE.   Please read over the following fact sheets that you were given.

## 2018-03-16 NOTE — Progress Notes (Signed)
PCP - Dr. Dimas Chyle  LOV 12/2017  Cardiologist -    Chest x-ray -   EKG -  12/2017  Stress Test -   ECHO - 12/2017  Cardiac Cath -   Sleep Study -  More than 5 yrs ago CPAP - yes   Fasting Blood Sugar - n/a Checks Blood Sugar _____ times a day  Blood Thinner Instructions: Aspirin Instructions:  Anesthesia review:   Patient denies shortness of breath, fever, cough and chest pain at PAT appointment   Patient verbalized understanding of instructions that were given to them at the PAT appointment. Patient was also instructed that they will need to review over the PAT instructions again at home before surgery.

## 2018-03-17 ENCOUNTER — Encounter (HOSPITAL_COMMUNITY): Payer: Self-pay

## 2018-03-17 NOTE — Progress Notes (Signed)
Anesthesia Chart Review:  Case:  [id number] Date/Time:  03/23/18 1143   Procedure:  Right Lumbar 3-4 Lumbar 4-5 Laminectomy/Foraminotomy with sublaminar decompression (Right Back) - Right Lumbar 3-4 Lumbar 4-5 Laminectomy/Foraminotomy with sublaminar decompression   Anesthesia type:  General   Pre-op diagnosis:  Lumbar stenosis without neurogenic claudication   Location:  [facility] OR ROOM 20 / [place] [facility]   Surgeon:  [name], MD      DISCUSSION: Patient is a 68 year old male scheduled for the above procedure.  History includes never smoker, CAD (medical therapy, 02/20/18 cardiac CT/FFR), murmur (physiologic TR 01/17/18 echo), HTN, malignant melanoma (shoulder), OSA (CPAP), TAA (4 cm ascending TAA 02/20/18). BMI is consistent with obesity.  According to recent cardiology evaluation by Truitt Merle, NP on 03/06/18: "Abnormal coronary CT - he has CAD - primarily in the branches of the LAD - FFR was abnormal - but being in the distal areas - medical therapy was recommended - he is an acceptable candidate for his upcoming back surgery with Dr. Ronnald Ramp. Discussed with Dr. Irish Lack (DOD) who is in agreement. Would like to increase his statin - goal LDL is now less than 70. Would start baby aspirin when ok with neurosurgery. I will see him in about 3 to 4 months. CV risk factor modification will be imperative going forward."   If no acute changes then I anticipate that he can proceed as planned.   VS: BP (!) 151/68   Pulse 88   Temp 36.6 C   Resp 20   Ht 5\' 7"  (1.702 m)   Wt 95 kg   SpO2 97%   BMI 32.80 kg/m   PROVIDERS: Vivi Barrack, MD is PCP. Last visit 02/27/18. Cardiologist is with [facility]. Last visit with Truitt Merle, NP on 03/06/18. He was initially seen in 12/2016 due to multiple risk factors. Testing as outlined below.      LABS: Labs reviewed: Acceptable for surgery. Non-fasting glucose 168. A1c on 05/11/08 was 5.9. (all labs ordered are listed, but only abnormal results are  displayed)  Labs Reviewed  BASIC METABOLIC PANEL - Abnormal; Notable for the following components:      Result Value   CO2 20 (*)    Glucose, Bld 168 (*)    BUN 25 (*)    GFR calc non Af Amer 60 (*)    All other components within normal limits  SURGICAL PCR SCREEN  CBC    IMAGES: CT chest 02/20/18: IMPRESSION: 4 cm ascending thoracic aortic aneurysm. Recommend annual imaging followup by CTA or MRA. This recommendation follows 2010 ACCF/AHA/AATS/ACR/ASA/SCA/SCAI/SIR/STS/SVM Guidelines for the Diagnosis and Management of Patients with Thoracic Aortic Disease. Circulation. 2010; 121: M086-P619  MRI L-spine 02/01/18: IMPRESSION: - Congenitally narrow central canal in the mid and lower lumbar segments. - Moderately severe to severe central canal stenosis at L3-4 with marked narrowing in both lateral recesses and encroachment on both descending L4 roots. - Moderately severe to severe central canal stenosis and right worse than left subarticular recess narrowing at L4-5. Synovial cyst off the right facet joint contributes to severe narrowing in the right subarticular recess. There is encroachment on both descending L5 roots. - Left paracentral and lateral recess disc protrusion at L5-S1 impinges on the descending left S1 root.   EKG: 01/16/18: NSR   CV: Coronary CT 02/20/18: FINDINGS: - Non-cardiac: See separate report from [facility] Radiology. No significant findings on limited lung and soft tissue windows. - Calcium score: Calcium noted in proximal LAD/circumflex and  D1 - Coronary Arteries: Left dominant with no anomalies LM: Normal LAD: Less than 30% calcific plaque proximally 50% calcific plaque prior to take-off of D2 D1: 50% or less calcific stenosis D2: Less than 50% calcific plaque Circumflex: Less than 30% calcific stenosis proximally OM1: Normal OM2: Normal PDA: Normal PLA Normal RCA: Normal IMPRESSION: 1. Calcium score 137 which is 53 rd percentile  for age and sex 2. Mild aortic root dilatation 4.0 cm 3. CAD worst lesion in mid LAD prior to D2 take off 50% Study will be sent for FFR CT FFR FINDINGS: - FFR CT is normal in the RCA, LM, LAD. D2 is normal at.90 The most distal aspect of OM1 is abnormal at.73. The most distal aspect of the left sided - PDA is abnormal at.78 IMPRESSION: FFR CT is abnormal in the most distal aspect of OM1 and distal left sided PDA. Would consider medical Rx initially as abnormalities only in the most Distal aspect of small vessels  Echo 01/17/18: Study Conclusions - Left ventricle: The cavity size was normal. There was mild   concentric hypertrophy. Systolic function was normal. The   estimated ejection fraction was in the range of 60% to 65%. Wall   motion was normal; there were no regional wall motion   abnormalities. Doppler parameters are consistent with abnormal   left ventricular relaxation (grade 1 diastolic dysfunction).   Doppler parameters are consistent with indeterminate ventricular   filling pressure. - Aortic valve: Transvalvular velocity was within the normal range.   There was no stenosis. There was no regurgitation. - Aorta: Ascending aortic diameter: 41 mm (S). - Ascending aorta: The ascending aorta was mildly dilated. - Mitral valve: Transvalvular velocity was within the normal range.   There was no evidence for stenosis. There was no regurgitation. - Left atrium: The atrium was mildly dilated. - Right ventricle: The cavity size was normal. Wall thickness was   normal. Systolic function was normal. - Pulmonary arteries: Systolic pressure was within the normal   range. - Global longitudinal strain -19.3% (normal).   Past Medical History:  Diagnosis Date  . Arthritis   . Cancer (HCC)    HX OF MALIGNANT MELANOMA SHOULDER   . Coronary artery disease    Medical thearpy, 02/20/18 CT coronary/FFR  . Heart murmur   . History of kidney stones     "A FEW YRS AGO"  .  Hypertension   . Sleep apnea    CPAP- 4 GOES TO 12 OR 14   . Thoracic ascending aortic aneurysm (HCC)    4 cm 02/20/18    Past Surgical History:  Procedure Laterality Date  . APPENDECTOMY    . JOINT REPLACEMENT     RIGHT KNEE REPLACEMENT   . KNEE ARTHROSCOPY     LEFT   . RIGHT KNEE SURGERY     . TOTAL KNEE ARTHROPLASTY  12/10/2011   Procedure: TOTAL KNEE ARTHROPLASTY;  Surgeon: [name], MD;  Location: [facility];  Service: Orthopedics;  Laterality: Left;  [name] VASECTOMY      MEDICATIONS: . allopurinol (ZYLOPRIM) 100 MG tablet  . atorvastatin (LIPITOR) 20 MG tablet  . Coenzyme Q10 (CO Q 10 PO)  . colchicine 0.6 MG tablet  . ibuprofen (ADVIL,MOTRIN) 200 MG tablet  . losartan (COZAAR) 100 MG tablet  . Multiple Vitamin (MULTIVITAMIN) tablet  . sildenafil (REVATIO) 20 MG tablet   No current facility-administered medications for this encounter.     Myra Gianotti, PA-C Surgical Short Stay/Anesthesiology [facility] Phone [phone])  [phone] [facility] Phone [phone] 03/17/2018 3:53 PM

## 2018-03-17 NOTE — Anesthesia Preprocedure Evaluation (Addendum)
Anesthesia Evaluation  Patient identified by MRN, date of birth, ID band Patient awake    Reviewed: Allergy & Precautions, Patient's Chart, lab work & pertinent test results  Airway Mallampati: II  TM Distance: >3 FB     Dental   Pulmonary    breath sounds clear to auscultation       Cardiovascular hypertension, + CAD   Rhythm:Regular Rate:Normal     Neuro/Psych    GI/Hepatic negative GI ROS, Neg liver ROS,   Endo/Other    Renal/GU negative Renal ROS     Musculoskeletal   Abdominal   Peds  Hematology   Anesthesia Other Findings   Reproductive/Obstetrics                            Anesthesia Physical Anesthesia Plan  ASA: III  Anesthesia Plan: General   Post-op Pain Management:    Induction: Intravenous  PONV Risk Score and Plan: Ondansetron, Dexamethasone and Midazolam  Airway Management Planned: Oral ETT  Additional Equipment:   Intra-op Plan:   Post-operative Plan:   Informed Consent: I have reviewed the patients History and Physical, chart, labs and discussed the procedure including the risks, benefits and alternatives for the proposed anesthesia with the patient or authorized representative who has indicated his/her understanding and acceptance.     Dental advisory given  Plan Discussed with: Anesthesiologist and CRNA  Anesthesia Plan Comments: (PAT note written 03/17/2018 by Myra Gianotti, PA-C. )       Anesthesia Quick Evaluation

## 2018-03-23 ENCOUNTER — Ambulatory Visit (HOSPITAL_COMMUNITY)
Admission: RE | Admit: 2018-03-23 | Discharge: 2018-03-24 | Disposition: A | Discharge status: Home / Self Care | Payer: Medicare Other | Source: Ambulatory Visit | Attending: Neurological Surgery | Admitting: Neurological Surgery

## 2018-03-23 ENCOUNTER — Encounter (HOSPITAL_COMMUNITY): Payer: Self-pay | Admitting: Neurological Surgery

## 2018-03-23 ENCOUNTER — Encounter (HOSPITAL_COMMUNITY)
Admission: RE | Disposition: A | Discharge status: Home / Self Care | Payer: Self-pay | Source: Ambulatory Visit | Attending: Neurological Surgery

## 2018-03-23 ENCOUNTER — Ambulatory Visit (HOSPITAL_COMMUNITY): Payer: Medicare Other

## 2018-03-23 ENCOUNTER — Ambulatory Visit (HOSPITAL_COMMUNITY): Payer: Medicare Other | Admitting: Vascular Surgery

## 2018-03-23 ENCOUNTER — Ambulatory Visit (HOSPITAL_COMMUNITY): Payer: Medicare Other | Admitting: Certified Registered"

## 2018-03-23 ENCOUNTER — Other Ambulatory Visit: Payer: Self-pay

## 2018-03-23 DIAGNOSIS — M1990 Unspecified osteoarthritis, unspecified site: Secondary | ICD-10-CM | POA: Diagnosis not present

## 2018-03-23 DIAGNOSIS — Z79899 Other long term (current) drug therapy: Secondary | ICD-10-CM | POA: Diagnosis not present

## 2018-03-23 DIAGNOSIS — Z8042 Family history of malignant neoplasm of prostate: Secondary | ICD-10-CM | POA: Insufficient documentation

## 2018-03-23 DIAGNOSIS — Z87442 Personal history of urinary calculi: Secondary | ICD-10-CM | POA: Diagnosis not present

## 2018-03-23 DIAGNOSIS — Z833 Family history of diabetes mellitus: Secondary | ICD-10-CM | POA: Insufficient documentation

## 2018-03-23 DIAGNOSIS — M5416 Radiculopathy, lumbar region: Secondary | ICD-10-CM | POA: Insufficient documentation

## 2018-03-23 DIAGNOSIS — Z85820 Personal history of malignant melanoma of skin: Secondary | ICD-10-CM | POA: Diagnosis not present

## 2018-03-23 DIAGNOSIS — M48061 Spinal stenosis, lumbar region without neurogenic claudication: Secondary | ICD-10-CM | POA: Diagnosis not present

## 2018-03-23 DIAGNOSIS — Z419 Encounter for procedure for purposes other than remedying health state, unspecified: Secondary | ICD-10-CM

## 2018-03-23 DIAGNOSIS — I714 Abdominal aortic aneurysm, without rupture: Secondary | ICD-10-CM | POA: Diagnosis not present

## 2018-03-23 DIAGNOSIS — Z8249 Family history of ischemic heart disease and other diseases of the circulatory system: Secondary | ICD-10-CM | POA: Diagnosis not present

## 2018-03-23 DIAGNOSIS — G4730 Sleep apnea, unspecified: Secondary | ICD-10-CM | POA: Diagnosis not present

## 2018-03-23 DIAGNOSIS — R011 Cardiac murmur, unspecified: Secondary | ICD-10-CM | POA: Insufficient documentation

## 2018-03-23 DIAGNOSIS — M545 Low back pain: Secondary | ICD-10-CM | POA: Diagnosis not present

## 2018-03-23 DIAGNOSIS — I2510 Atherosclerotic heart disease of native coronary artery without angina pectoris: Secondary | ICD-10-CM | POA: Diagnosis not present

## 2018-03-23 DIAGNOSIS — Z791 Long term (current) use of non-steroidal anti-inflammatories (NSAID): Secondary | ICD-10-CM | POA: Insufficient documentation

## 2018-03-23 DIAGNOSIS — I10 Essential (primary) hypertension: Secondary | ICD-10-CM | POA: Insufficient documentation

## 2018-03-23 DIAGNOSIS — M7138 Other bursal cyst, other site: Secondary | ICD-10-CM | POA: Insufficient documentation

## 2018-03-23 DIAGNOSIS — Z8052 Family history of malignant neoplasm of bladder: Secondary | ICD-10-CM | POA: Insufficient documentation

## 2018-03-23 DIAGNOSIS — Z6832 Body mass index (BMI) 32.0-32.9, adult: Secondary | ICD-10-CM | POA: Insufficient documentation

## 2018-03-23 DIAGNOSIS — E669 Obesity, unspecified: Secondary | ICD-10-CM | POA: Insufficient documentation

## 2018-03-23 DIAGNOSIS — Z9889 Other specified postprocedural states: Secondary | ICD-10-CM

## 2018-03-23 DIAGNOSIS — Z96652 Presence of left artificial knee joint: Secondary | ICD-10-CM | POA: Insufficient documentation

## 2018-03-23 HISTORY — PX: LUMBAR LAMINECTOMY/DECOMPRESSION MICRODISCECTOMY: SHX5026

## 2018-03-23 SURGERY — LUMBAR LAMINECTOMY/DECOMPRESSION MICRODISCECTOMY 2 LEVELS
Anesthesia: General | Site: Back | Laterality: Bilateral

## 2018-03-23 MED ORDER — THROMBIN 5000 UNITS EX SOLR
CUTANEOUS | Status: DC | PRN
  Administered 2018-03-23 (×2): 5000 [IU] via TOPICAL

## 2018-03-23 MED ORDER — ONDANSETRON HCL 4 MG/2ML IJ SOLN
INTRAMUSCULAR | Status: DC | PRN
  Administered 2018-03-23: 4 mg via INTRAVENOUS

## 2018-03-23 MED ORDER — ROCURONIUM BROMIDE 10 MG/ML (PF) SYRINGE
PREFILLED_SYRINGE | INTRAVENOUS | Status: DC | PRN
  Administered 2018-03-23: 50 mg via INTRAVENOUS

## 2018-03-23 MED ORDER — PHENOL 1.4 % MT LIQD: 1.0000 | OROMUCOSAL | Status: DC | PRN

## 2018-03-23 MED ORDER — EPHEDRINE SULFATE-NACL 50-0.9 MG/10ML-% IV SOSY
PREFILLED_SYRINGE | INTRAVENOUS | Status: DC | PRN
  Administered 2018-03-23: 5 mg via INTRAVENOUS

## 2018-03-23 MED ORDER — SODIUM CHLORIDE 0.9% FLUSH
3.0000 mL | Freq: Two times a day (BID) | INTRAVENOUS | Status: DC
  Administered 2018-03-23 (×2): 3 mL via INTRAVENOUS

## 2018-03-23 MED ORDER — PHENYLEPHRINE 40 MCG/ML (10ML) SYRINGE FOR IV PUSH (FOR BLOOD PRESSURE SUPPORT)
PREFILLED_SYRINGE | INTRAVENOUS | Status: AC
  Filled 2018-03-23: qty 10

## 2018-03-23 MED ORDER — SUCCINYLCHOLINE CHLORIDE 200 MG/10ML IV SOSY
PREFILLED_SYRINGE | INTRAVENOUS | Status: DC | PRN
  Administered 2018-03-23: 140 mg via INTRAVENOUS

## 2018-03-23 MED ORDER — HYDROCODONE-ACETAMINOPHEN 7.5-325 MG PO TABS
1.0000 | ORAL_TABLET | Freq: Four times a day (QID) | ORAL | Status: DC
  Administered 2018-03-23 (×3): 1 via ORAL
  Filled 2018-03-23 (×3): qty 1

## 2018-03-23 MED ORDER — ONDANSETRON HCL 4 MG/2ML IJ SOLN: 4.0000 mg | Freq: Four times a day (QID) | INTRAMUSCULAR | Status: DC | PRN

## 2018-03-23 MED ORDER — PROPOFOL 10 MG/ML IV BOLUS
INTRAVENOUS | Status: AC
  Filled 2018-03-23: qty 20

## 2018-03-23 MED ORDER — MIDAZOLAM HCL 2 MG/2ML IJ SOLN
INTRAMUSCULAR | Status: AC
  Filled 2018-03-23: qty 2

## 2018-03-23 MED ORDER — LIDOCAINE 2% (20 MG/ML) 5 ML SYRINGE
INTRAMUSCULAR | Status: DC | PRN
  Administered 2018-03-23: 100 mg via INTRAVENOUS

## 2018-03-23 MED ORDER — FENTANYL CITRATE (PF) 100 MCG/2ML IJ SOLN
25.0000 ug | INTRAMUSCULAR | Status: DC | PRN
  Administered 2018-03-23 (×3): 50 ug via INTRAVENOUS

## 2018-03-23 MED ORDER — METHOCARBAMOL 500 MG PO TABS
ORAL_TABLET | ORAL | Status: AC
  Administered 2018-03-23: 500 mg via ORAL
  Filled 2018-03-23: qty 1

## 2018-03-23 MED ORDER — HYDROCODONE-ACETAMINOPHEN 7.5-325 MG PO TABS
ORAL_TABLET | ORAL | Status: AC
  Administered 2018-03-23: 1 via ORAL
  Filled 2018-03-23: qty 1

## 2018-03-23 MED ORDER — SUGAMMADEX SODIUM 200 MG/2ML IV SOLN
INTRAVENOUS | Status: DC | PRN
  Administered 2018-03-23: 200 mg via INTRAVENOUS

## 2018-03-23 MED ORDER — PHENYLEPHRINE 40 MCG/ML (10ML) SYRINGE FOR IV PUSH (FOR BLOOD PRESSURE SUPPORT)
PREFILLED_SYRINGE | INTRAVENOUS | Status: DC | PRN
  Administered 2018-03-23: 80 ug via INTRAVENOUS
  Administered 2018-03-23: 120 ug via INTRAVENOUS
  Administered 2018-03-23 (×4): 80 ug via INTRAVENOUS

## 2018-03-23 MED ORDER — FENTANYL CITRATE (PF) 250 MCG/5ML IJ SOLN
INTRAMUSCULAR | Status: AC
  Filled 2018-03-23: qty 5

## 2018-03-23 MED ORDER — HEMOSTATIC AGENTS (NO CHARGE) OPTIME
TOPICAL | Status: DC | PRN
  Administered 2018-03-23: 1

## 2018-03-23 MED ORDER — LACTATED RINGERS IV SOLN
INTRAVENOUS | Status: DC
  Administered 2018-03-23: 10:00:00 via INTRAVENOUS

## 2018-03-23 MED ORDER — BUPIVACAINE HCL (PF) 0.25 % IJ SOLN
INTRAMUSCULAR | Status: DC | PRN
  Administered 2018-03-23: 4 mL

## 2018-03-23 MED ORDER — ROCURONIUM BROMIDE 50 MG/5ML IV SOSY
PREFILLED_SYRINGE | INTRAVENOUS | Status: AC
  Filled 2018-03-23: qty 5

## 2018-03-23 MED ORDER — MORPHINE SULFATE (PF) 2 MG/ML IV SOLN: 2.0000 mg | INTRAVENOUS | Status: DC | PRN

## 2018-03-23 MED ORDER — CHLORHEXIDINE GLUCONATE CLOTH 2 % EX PADS: 6.0000 | MEDICATED_PAD | Freq: Once | CUTANEOUS | Status: DC

## 2018-03-23 MED ORDER — ACETAMINOPHEN 650 MG RE SUPP: 650.0000 mg | RECTAL | Status: DC | PRN

## 2018-03-23 MED ORDER — MENTHOL 3 MG MT LOZG: 1.0000 | LOZENGE | OROMUCOSAL | Status: DC | PRN

## 2018-03-23 MED ORDER — DEXAMETHASONE SODIUM PHOSPHATE 10 MG/ML IJ SOLN
INTRAMUSCULAR | Status: AC
  Filled 2018-03-23: qty 1

## 2018-03-23 MED ORDER — THROMBIN 5000 UNITS EX SOLR
OROMUCOSAL | Status: DC | PRN
  Administered 2018-03-23: 10:00:00

## 2018-03-23 MED ORDER — SODIUM CHLORIDE 0.9 % IV SOLN
INTRAVENOUS | Status: DC | PRN
  Administered 2018-03-23: 500 mL

## 2018-03-23 MED ORDER — 0.9 % SODIUM CHLORIDE (POUR BTL) OPTIME
TOPICAL | Status: DC | PRN
  Administered 2018-03-23: 1000 mL

## 2018-03-23 MED ORDER — DEXAMETHASONE SODIUM PHOSPHATE 10 MG/ML IJ SOLN
INTRAMUSCULAR | Status: DC | PRN
  Administered 2018-03-23: 10 mg via INTRAVENOUS

## 2018-03-23 MED ORDER — POTASSIUM CHLORIDE IN NACL 20-0.9 MEQ/L-% IV SOLN: INTRAVENOUS | Status: DC

## 2018-03-23 MED ORDER — LOSARTAN POTASSIUM 50 MG PO TABS
100.0000 mg | ORAL_TABLET | Freq: Every morning | ORAL | Status: DC
  Administered 2018-03-24: 100 mg via ORAL
  Filled 2018-03-23: qty 2

## 2018-03-23 MED ORDER — SODIUM CHLORIDE 0.9 % IV SOLN
INTRAVENOUS | Status: DC | PRN
  Administered 2018-03-23: 30 ug/min via INTRAVENOUS

## 2018-03-23 MED ORDER — MIDAZOLAM HCL 2 MG/2ML IJ SOLN
INTRAMUSCULAR | Status: DC | PRN
  Administered 2018-03-23: 2 mg via INTRAVENOUS

## 2018-03-23 MED ORDER — BUPIVACAINE HCL (PF) 0.25 % IJ SOLN
INTRAMUSCULAR | Status: AC
  Filled 2018-03-23: qty 30

## 2018-03-23 MED ORDER — CEFAZOLIN SODIUM-DEXTROSE 2-4 GM/100ML-% IV SOLN
2.0000 g | Freq: Three times a day (TID) | INTRAVENOUS | Status: AC
  Administered 2018-03-23 (×2): 2 g via INTRAVENOUS
  Filled 2018-03-23 (×2): qty 100

## 2018-03-23 MED ORDER — THROMBIN 5000 UNITS EX SOLR
CUTANEOUS | Status: AC
  Filled 2018-03-23: qty 15000

## 2018-03-23 MED ORDER — PROPOFOL 10 MG/ML IV BOLUS
INTRAVENOUS | Status: DC | PRN
  Administered 2018-03-23: 150 mg via INTRAVENOUS

## 2018-03-23 MED ORDER — EPHEDRINE 5 MG/ML INJ
INTRAVENOUS | Status: AC
  Filled 2018-03-23: qty 10

## 2018-03-23 MED ORDER — LIDOCAINE 2% (20 MG/ML) 5 ML SYRINGE
INTRAMUSCULAR | Status: AC
  Filled 2018-03-23: qty 5

## 2018-03-23 MED ORDER — ONDANSETRON HCL 4 MG/2ML IJ SOLN
INTRAMUSCULAR | Status: AC
  Filled 2018-03-23: qty 2

## 2018-03-23 MED ORDER — METHOCARBAMOL 1000 MG/10ML IJ SOLN
500.0000 mg | Freq: Four times a day (QID) | INTRAVENOUS | Status: DC | PRN
  Filled 2018-03-23: qty 5

## 2018-03-23 MED ORDER — LACTATED RINGERS IV SOLN
INTRAVENOUS | Status: DC | PRN
  Administered 2018-03-23 (×2): via INTRAVENOUS

## 2018-03-23 MED ORDER — CEFAZOLIN SODIUM-DEXTROSE 2-4 GM/100ML-% IV SOLN
2.0000 g | INTRAVENOUS | Status: AC
  Administered 2018-03-23: 2 g via INTRAVENOUS

## 2018-03-23 MED ORDER — FENTANYL CITRATE (PF) 100 MCG/2ML IJ SOLN
INTRAMUSCULAR | Status: AC
  Administered 2018-03-23: 50 ug via INTRAVENOUS
  Filled 2018-03-23: qty 2

## 2018-03-23 MED ORDER — ROCURONIUM BROMIDE 50 MG/5ML IV SOSY
PREFILLED_SYRINGE | INTRAVENOUS | Status: AC
  Filled 2018-03-23: qty 10

## 2018-03-23 MED ORDER — SENNA 8.6 MG PO TABS
1.0000 | ORAL_TABLET | Freq: Two times a day (BID) | ORAL | Status: DC
  Administered 2018-03-23: 8.6 mg via ORAL
  Filled 2018-03-23: qty 1

## 2018-03-23 MED ORDER — ONDANSETRON HCL 4 MG PO TABS: 4.0000 mg | ORAL_TABLET | Freq: Four times a day (QID) | ORAL | Status: DC | PRN

## 2018-03-23 MED ORDER — CEFAZOLIN SODIUM-DEXTROSE 2-4 GM/100ML-% IV SOLN
INTRAVENOUS | Status: AC
  Filled 2018-03-23: qty 100

## 2018-03-23 MED ORDER — ACETAMINOPHEN 325 MG PO TABS: 650.0000 mg | ORAL_TABLET | ORAL | Status: DC | PRN

## 2018-03-23 MED ORDER — METHOCARBAMOL 500 MG PO TABS
500.0000 mg | ORAL_TABLET | Freq: Four times a day (QID) | ORAL | Status: DC | PRN
  Administered 2018-03-23 – 2018-03-24 (×4): 500 mg via ORAL
  Filled 2018-03-23 (×3): qty 1

## 2018-03-23 MED ORDER — SODIUM CHLORIDE 0.9% FLUSH: 3.0000 mL | INTRAVENOUS | Status: DC | PRN

## 2018-03-23 MED ORDER — FENTANYL CITRATE (PF) 250 MCG/5ML IJ SOLN
INTRAMUSCULAR | Status: DC | PRN
  Administered 2018-03-23: 50 ug via INTRAVENOUS
  Administered 2018-03-23: 100 ug via INTRAVENOUS
  Administered 2018-03-23: 50 ug via INTRAVENOUS

## 2018-03-23 SURGICAL SUPPLY — 57 items
ADH SKN CLS APL DERMABOND .7 (GAUZE/BANDAGES/DRESSINGS) ×1
APL SKNCLS STERI-STRIP NONHPOA (GAUZE/BANDAGES/DRESSINGS) ×1
BAG DECANTER FOR FLEXI CONT (MISCELLANEOUS) ×3 IMPLANT
BENZOIN TINCTURE PRP APPL 2/3 (GAUZE/BANDAGES/DRESSINGS) ×3 IMPLANT
BUR MATCHSTICK NEURO 3.0 LAGG (BURR) ×3 IMPLANT
CANISTER SUCT 3000ML PPV (MISCELLANEOUS) ×3 IMPLANT
CARTRIDGE OIL MAESTRO DRILL (MISCELLANEOUS) ×1 IMPLANT
CLOSURE WOUND 1/2 X4 (GAUZE/BANDAGES/DRESSINGS) ×1
COVER WAND RF STERILE (DRAPES) ×1 IMPLANT
DERMABOND ADVANCED (GAUZE/BANDAGES/DRESSINGS) ×2
DERMABOND ADVANCED .7 DNX12 (GAUZE/BANDAGES/DRESSINGS) IMPLANT
DIFFUSER DRILL AIR PNEUMATIC (MISCELLANEOUS) ×3 IMPLANT
DRAPE LAPAROTOMY 100X72X124 (DRAPES) ×3 IMPLANT
DRAPE MICROSCOPE LEICA (MISCELLANEOUS) ×3 IMPLANT
DRAPE POUCH INSTRU U-SHP 10X18 (DRAPES) ×3 IMPLANT
DRAPE SURG 17X23 STRL (DRAPES) ×3 IMPLANT
DRSG OPSITE POSTOP 4X6 (GAUZE/BANDAGES/DRESSINGS) ×2 IMPLANT
DURAPREP 26ML APPLICATOR (WOUND CARE) ×3 IMPLANT
ELECT REM PT RETURN 9FT ADLT (ELECTROSURGICAL) ×3
ELECTRODE REM PT RTRN 9FT ADLT (ELECTROSURGICAL) ×1 IMPLANT
GAUZE 4X4 16PLY RFD (DISPOSABLE) IMPLANT
GLOVE BIO SURGEON STRL SZ 6.5 (GLOVE) ×4 IMPLANT
GLOVE BIO SURGEON STRL SZ7 (GLOVE) ×2 IMPLANT
GLOVE BIO SURGEON STRL SZ8 (GLOVE) ×3 IMPLANT
GLOVE BIO SURGEONS STRL SZ 6.5 (GLOVE) ×4
GLOVE BIOGEL PI IND STRL 6.5 (GLOVE) IMPLANT
GLOVE BIOGEL PI IND STRL 7.0 (GLOVE) IMPLANT
GLOVE BIOGEL PI IND STRL 7.5 (GLOVE) IMPLANT
GLOVE BIOGEL PI INDICATOR 6.5 (GLOVE) ×6
GLOVE BIOGEL PI INDICATOR 7.0 (GLOVE) ×2
GLOVE BIOGEL PI INDICATOR 7.5 (GLOVE) ×2
GOWN STRL REUS W/ TWL LRG LVL3 (GOWN DISPOSABLE) IMPLANT
GOWN STRL REUS W/ TWL XL LVL3 (GOWN DISPOSABLE) ×1 IMPLANT
GOWN STRL REUS W/TWL 2XL LVL3 (GOWN DISPOSABLE) IMPLANT
GOWN STRL REUS W/TWL LRG LVL3 (GOWN DISPOSABLE) ×9
GOWN STRL REUS W/TWL XL LVL3 (GOWN DISPOSABLE) ×3
HEMOSTAT POWDER KIT SURGIFOAM (HEMOSTASIS) ×2 IMPLANT
KIT BASIN OR (CUSTOM PROCEDURE TRAY) ×3 IMPLANT
KIT TURNOVER KIT B (KITS) ×3 IMPLANT
NDL HYPO 25X1 1.5 SAFETY (NEEDLE) ×1 IMPLANT
NDL SPNL 20GX3.5 QUINCKE YW (NEEDLE) IMPLANT
NEEDLE HYPO 25X1 1.5 SAFETY (NEEDLE) ×3 IMPLANT
NEEDLE SPNL 20GX3.5 QUINCKE YW (NEEDLE) ×3 IMPLANT
NS IRRIG 1000ML POUR BTL (IV SOLUTION) ×3 IMPLANT
OIL CARTRIDGE MAESTRO DRILL (MISCELLANEOUS) ×3
PACK LAMINECTOMY NEURO (CUSTOM PROCEDURE TRAY) ×3 IMPLANT
PAD ARMBOARD 7.5X6 YLW CONV (MISCELLANEOUS) ×9 IMPLANT
RUBBERBAND STERILE (MISCELLANEOUS) ×6 IMPLANT
SPONGE SURGIFOAM ABS GEL SZ50 (HEMOSTASIS) ×2 IMPLANT
STRIP CLOSURE SKIN 1/2X4 (GAUZE/BANDAGES/DRESSINGS) ×2 IMPLANT
SUT VIC AB 0 CT1 18XCR BRD8 (SUTURE) ×1 IMPLANT
SUT VIC AB 0 CT1 8-18 (SUTURE) ×3
SUT VIC AB 2-0 CP2 18 (SUTURE) ×3 IMPLANT
SUT VIC AB 3-0 SH 8-18 (SUTURE) ×3 IMPLANT
TOWEL GREEN STERILE (TOWEL DISPOSABLE) ×3 IMPLANT
TOWEL GREEN STERILE FF (TOWEL DISPOSABLE) ×3 IMPLANT
WATER STERILE IRR 1000ML POUR (IV SOLUTION) ×3 IMPLANT

## 2018-03-23 NOTE — Progress Notes (Signed)
Patient ID: Nathan Collins, male   DOB: 1950/08/21, 68 y.o.   MRN: [id number] Looks great postop.  Moving legs well.  No leg pain.  Pleased with progress.

## 2018-03-23 NOTE — Op Note (Signed)
03/23/2018  12:15 PM  PATIENT:  Nathan Collins  68 y.o. male  PRE-OPERATIVE DIAGNOSIS: Lumbar spinal stenosis L3-4 and L4-5 with right-sided synovial cyst L4-5 compressing the right L5 nerve root with back and right L5 radiculopathy  POST-OPERATIVE DIAGNOSIS:  same  PROCEDURE: Decompressive lumbar laminectomy, medial facetectomy and foraminotomies L3-4 and L4-5 bilaterally with resection of right-sided L4-5 synovial cyst compressing the right L5 nerve root  SURGEON:  Sherley Bounds, MD  ASSISTANTS: Glenford Peers FNP  ANESTHESIA:   General  EBL: 100 ml  Total I/O In: 1000 [I.V.:1000] Out: 100 [Blood:100]  BLOOD ADMINISTERED: none  DRAINS: none  SPECIMEN:  none  INDICATION FOR PROCEDURE: This patient presented with severe right leg pain in an L5 distribution. Imaging showed severe spinal stenosis L3-4 and L4-5 with a right-sided synovial cyst L4-5. The patient tried conservative measures without relief. Pain was debilitating. Recommended decompressive laminectomy with resection of the synovial cyst. Patient understood the risks, benefits, and alternatives and potential outcomes and wished to proceed.  PROCEDURE DETAILS: The patient was taken to the operating room and after induction of adequate generalized endotracheal anesthesia, the patient was rolled into the prone position on the Wilson frame and all pressure points were padded. The lumbar region was cleaned and then prepped with DuraPrep and draped in the usual sterile fashion. 5 cc of local anesthesia was injected and then a dorsal midline incision was made and carried down to the lumbo sacral fascia. The fascia was opened and the paraspinous musculature was taken down in a subperiosteal fashion to expose L3-4 and L4-5. Intraoperative x-ray confirmed my level, and then I used a combination of the high-speed drill and the Kerrison punches to perform a laminectomy, medial facetectomy, and foraminotomy at 3 4 and L4-5 bilaterally.  The underlying yellow ligament was opened and removed in a piecemeal fashion to expose the underlying dura and exiting nerve roots.  He had severe central stenosis as well as severe lateral recess stenosis.  I undercut the lateral recess and dissected down until I was medial to and distal to the pedicle at both levels bilaterally. The nerve root was well decompressed.  There was a significant right L4-5 synovial cyst which was adherent to the dura and compressive on the right L5 nerve root.  We spent considerable time resecting this.  Teased away from the dura and then would remove it with a pituitary rongeur or a Kerrison punch.  I undercut the lateral recess continued to decompress the L5 nerve root until I was distal to the pedicle on the right.  At no time did we see evidence of a CSF leak.  I then palpated with a coronary dilator along the nerve root and into the foramen to assure adequate decompression. I felt no more compression of the nerve root. I irrigated with saline solution containing bacitracin. Achieved hemostasis with bipolar cautery, lined the dura with Gelfoam, and then closed the fascia with 0 Vicryl. I closed the subcutaneous tissues with 2-0 Vicryl and the subcuticular tissues with 3-0 Vicryl. The skin was then closed with benzoin and Steri-Strips. The drapes were removed, a sterile dressing was applied. The patient was awakened from general anesthesia and transferred to the recovery room in stable condition. At the end of the procedure all sponge, needle and instrument counts were correct.    PLAN OF CARE: Admit for overnight observation  PATIENT DISPOSITION:  PACU - hemodynamically stable.   Delay start of Pharmacological VTE agent (>24hrs) due to surgical blood  loss or risk of bleeding:  yes

## 2018-03-23 NOTE — Anesthesia Postprocedure Evaluation (Signed)
Anesthesia Post Note  Patient: Nathan Collins  Procedure(s) Performed: Lumbar Three-Four Lumbar Four-Five Laminectomy/Foraminotomy with sublaminar decompression (Bilateral Back)     Patient location during evaluation: PACU Anesthesia Type: General Level of consciousness: awake Pain management: pain level controlled Vital Signs Assessment: post-procedure vital signs reviewed and stable Respiratory status: spontaneous breathing Cardiovascular status: stable Postop Assessment: no apparent nausea or vomiting Anesthetic complications: no    Last Vitals:  Vitals:   03/23/18 1500 03/23/18 1535  BP: 136/84 (!) 143/90  Pulse: 92 92  Resp: 14 19  Temp: 36.5 C 36.7 C  SpO2: 97%     Last Pain:  Vitals:   03/23/18 1535  TempSrc: Oral  PainSc:                  Karra Pink

## 2018-03-23 NOTE — H&P (Signed)
Subjective: Patient is a 68 y.o. male admitted for back and leg pain. Onset of symptoms was several months ago, unchanged since that time.  The pain is rated moderate, and is located at the across the lower back and radiates to right leg. The pain is described as aching and occurs all day. The symptoms have been progressive. Symptoms are exacerbated by exercise. MRI or CT showed severe spinal stenosis with synovial cyst formation L3 4 and L4-5  Past Medical History:  Diagnosis Date  . Arthritis   . Cancer (HCC)    HX OF MALIGNANT MELANOMA SHOULDER   . Coronary artery disease    Medical thearpy, 02/20/18 CT coronary/FFR  . Heart murmur   . History of kidney stones     "A FEW YRS AGO"  . Hypertension   . Sleep apnea    CPAP- 4 GOES TO 12 OR 14   . Thoracic ascending aortic aneurysm (HCC)    4 cm 02/20/18    Past Surgical History:  Procedure Laterality Date  . APPENDECTOMY    . JOINT REPLACEMENT     RIGHT KNEE REPLACEMENT   . KNEE ARTHROSCOPY     LEFT   . RIGHT KNEE SURGERY     . TOTAL KNEE ARTHROPLASTY  12/10/2011   Procedure: TOTAL KNEE ARTHROPLASTY;  Surgeon: [name], MD;  Location: [facility];  Service: Orthopedics;  Laterality: Left;  [name] VASECTOMY      Prior to Admission medications   Medication Sig Start Date End Date Taking? Authorizing Provider  allopurinol (ZYLOPRIM) 100 MG tablet Take 2 tablets (200 mg total) by mouth every morning. 12/29/17  Yes Vivi Barrack, MD  atorvastatin (LIPITOR) 20 MG tablet Take 1 tablet (20 mg total) by mouth daily. 03/10/18  Yes Burtis Junes, NP  Coenzyme Q10 (CO Q 10 PO) Take 200 mg by mouth daily.    Yes [provider]  ibuprofen (ADVIL,MOTRIN) 200 MG tablet Take 800 mg by mouth every 4 (four) hours as needed for moderate pain.   Yes [provider]  losartan (COZAAR) 100 MG tablet Take 1 tablet (100 mg total) by mouth every morning. 12/23/17  Yes Vivi Barrack, MD  Multiple Vitamin (MULTIVITAMIN) tablet Take 1  tablet by mouth daily.   Yes [provider]  colchicine 0.6 MG tablet Take 0.6 mg by mouth daily as needed (gout).    [provider]  sildenafil (REVATIO) 20 MG tablet Take 100 mg by mouth daily as needed (ED).    [provider]   Allergies  Allergen Reactions  . Indocin [Indomethacin] Hives    No reaction to other NSAIDs.     Social History   Tobacco Use  . Smoking status: Never Smoker  . Smokeless tobacco: Never Used  Substance Use Topics  . Alcohol use: Not Currently    Family History  Problem Relation Age of Onset  . Diabetes Father        deceased 03-Jun-2008  . Prostate cancer Father        cabg   . Heart failure Father   . Bladder Cancer Mother        pacemaker June 04, 2010  . Peripheral Artery Disease Mother   . Arrhythmia Mother   . Heart failure Mother   . Hyperlipidemia Mother      Review of Systems  Positive ROS: neg  All other systems have been reviewed and were otherwise negative with the exception of those mentioned in the HPI and  as above.  Objective: Vital signs in last 24 hours: Temp:  [98.7 F (37.1 C)] 98.7 F (37.1 C) (01/30 0950) Pulse Rate:  [78] 78 (01/30 0950) Resp:  [20] 20 (01/30 0950) BP: (147)/(90) 147/90 (01/30 0950) SpO2:  [94 %] 94 % (01/30 0950) Weight:  [93.9 kg] 93.9 kg (01/30 0950)  General Appearance: Alert, cooperative, no distress, appears stated age Head: Normocephalic, without obvious abnormality, atraumatic Eyes: PERRL, conjunctiva/corneas clear, EOM's intact    Neck: Supple, symmetrical, trachea midline Back: Symmetric, no curvature, ROM normal, no CVA tenderness Lungs:  respirations unlabored Heart: Regular rate and rhythm Abdomen: Soft, non-tender Extremities: Extremities normal, atraumatic, no cyanosis or edema Pulses: 2+ and symmetric all extremities Skin: Skin color, texture, turgor normal, no rashes or lesions  NEUROLOGIC:   Mental status: Alert and oriented x4,  no aphasia, good attention  span, fund of knowledge, and memory Motor Exam - grossly normal Sensory Exam - grossly normal Reflexes: 1+ Coordination - grossly normal Gait -not tested Balance -not tested Cranial Nerves: I: smell Not tested  II: visual acuity  OS: nl    OD: nl  II: visual fields Full to confrontation  II: pupils Equal, round, reactive to light  III,VII: ptosis None  III,IV,VI: extraocular muscles  Full ROM  V: mastication Normal  V: facial light touch sensation  Normal  V,VII: corneal reflex  Present  VII: facial muscle function - upper  Normal  VII: facial muscle function - lower Normal  VIII: hearing Not tested  IX: soft palate elevation  Normal  IX,X: gag reflex Present  XI: trapezius strength  5/5  XI: sternocleidomastoid strength 5/5  XI: neck flexion strength  5/5  XII: tongue strength  Normal    Data Review Lab Results  Component Value Date   WBC 7.0 03/16/2018   HGB 13.6 03/16/2018   HCT 39.9 03/16/2018   MCV 87.9 03/16/2018   PLT 271 03/16/2018   Lab Results  Component Value Date   NA 141 03/16/2018   K 3.8 03/16/2018   CL 111 03/16/2018   CO2 20 (L) 03/16/2018   BUN 25 (H) 03/16/2018   CREATININE 1.23 03/16/2018   GLUCOSE 168 (H) 03/16/2018   Lab Results  Component Value Date   INR 0.97 12/07/2011    Assessment/Plan:  Estimated body mass index is 32.42 kg/m as calculated from the following:   Height as of this encounter: 5\' 7"  (1.702 m).   Weight as of this encounter: 93.9 kg. Patient admitted for decompressive laminectomy for stenosis and synovial cyst in this obese male. Patient has failed a reasonable attempt at conservative therapy.  I explained the condition and procedure to the patient and answered any questions.  Patient wishes to proceed with procedure as planned. Understands risks/ benefits and typical outcomes of procedure.   Nathan Collins 03/23/2018 10:01 AM

## 2018-03-23 NOTE — Anesthesia Procedure Notes (Signed)
Procedure Name: Intubation Date/Time: 03/23/2018 11:05 AM Performed by: Teressa Lower., CRNA Pre-anesthesia Checklist: Patient identified, Emergency Drugs available, Suction available and Patient being monitored Patient Re-evaluated:Patient Re-evaluated prior to induction Oxygen Delivery Method: Circle system utilized Preoxygenation: Pre-oxygenation with 100% oxygen Induction Type: IV induction Ventilation: Mask ventilation without difficulty and Oral airway inserted - appropriate to patient size Laryngoscope Size: Mac and 4 Grade View: Grade I Tube type: Oral Tube size: 7.5 mm Number of attempts: 1 Airway Equipment and Method: Stylet and Oral airway Placement Confirmation: ETT inserted through vocal cords under direct vision,  positive ETCO2 and breath sounds checked- equal and bilateral Secured at: 23 cm Tube secured with: Tape Dental Injury: Teeth and Oropharynx as per pre-operative assessment

## 2018-03-23 NOTE — Transfer of Care (Signed)
Immediate Anesthesia Transfer of Care Note  Patient: Nathan Collins  Procedure(s) Performed: Lumbar Three-Four Lumbar Four-Five Laminectomy/Foraminotomy with sublaminar decompression (Bilateral Back)  Patient Location: PACU  Anesthesia Type:General  Level of Consciousness: awake, alert  and oriented  Airway & Oxygen Therapy: Patient Spontanous Breathing and Patient connected to nasal cannula oxygen  Post-op Assessment: Report given to RN and Post -op Vital signs reviewed and stable  Post vital signs: Reviewed and stable  Last Vitals:  Vitals Value Taken Time  BP    Temp    Pulse    Resp    SpO2      Last Pain:  Vitals:   03/23/18 0950  TempSrc: Oral      Patients Stated Pain Goal: 3 (93/57/01 7793)  Complications: No apparent anesthesia complications

## 2018-03-24 ENCOUNTER — Encounter (HOSPITAL_COMMUNITY): Payer: Self-pay | Admitting: Neurological Surgery

## 2018-03-24 DIAGNOSIS — M7138 Other bursal cyst, other site: Secondary | ICD-10-CM | POA: Diagnosis not present

## 2018-03-24 DIAGNOSIS — I2510 Atherosclerotic heart disease of native coronary artery without angina pectoris: Secondary | ICD-10-CM | POA: Diagnosis not present

## 2018-03-24 DIAGNOSIS — M48061 Spinal stenosis, lumbar region without neurogenic claudication: Secondary | ICD-10-CM | POA: Diagnosis not present

## 2018-03-24 DIAGNOSIS — M5416 Radiculopathy, lumbar region: Secondary | ICD-10-CM | POA: Diagnosis not present

## 2018-03-24 DIAGNOSIS — M1990 Unspecified osteoarthritis, unspecified site: Secondary | ICD-10-CM | POA: Diagnosis not present

## 2018-03-24 DIAGNOSIS — Z85820 Personal history of malignant melanoma of skin: Secondary | ICD-10-CM | POA: Diagnosis not present

## 2018-03-24 MED ORDER — HYDROCODONE-ACETAMINOPHEN 7.5-325 MG PO TABS: 1.0000 | ORAL_TABLET | Freq: Four times a day (QID) | ORAL | 0 refills | Status: DC | PRN

## 2018-03-24 MED ORDER — HYDROCODONE-ACETAMINOPHEN 7.5-325 MG PO TABS
1.0000 | ORAL_TABLET | ORAL | Status: DC | PRN
  Administered 2018-03-24 (×2): 1 via ORAL
  Filled 2018-03-24: qty 1

## 2018-03-24 MED ORDER — METHOCARBAMOL 500 MG PO TABS: 500.0000 mg | ORAL_TABLET | Freq: Four times a day (QID) | ORAL | 1 refills | Status: DC | PRN

## 2018-03-24 MED ORDER — HYDROCODONE-ACETAMINOPHEN 7.5-325 MG PO TABS: 1.0000 | ORAL_TABLET | ORAL | 0 refills | Status: DC | PRN

## 2018-03-24 NOTE — Evaluation (Signed)
Occupational Therapy Evaluation Patient Details Name: Nathan Collins MRN: [id number] DOB: [birth year] Today's Date: 03/24/2018    History of Present Illness Lumbar Three-Four Lumbar Four-Five Laminectomy/Foraminotomy with sublaminar decompression    Clinical Impression   Pt able to ambulate with no AD and requires assist with LB ADLs. Educated/reviewed DME and A/E with pt and his wife for home use. Pt and his wife able to recall all back precautions and pt's wife able to assist pt back to bed with proper log roll technique as pt was having difficulty rolling onto his back using proper technique. Pt initially rated pan as 5/10 after sitting EOB, later c/o pf R Thigh cramping with back pain at 9/10. RN notified and in to give pain meds. All education completed and no further acute OT is indicated at [facility] time    Follow Up Recommendations  No OT follow up;Supervision - Intermittent    Equipment Recommendations  Toilet rise with handles;Other (comment);Tub/shower bench(toileting aid)    Recommendations for Other Services       Precautions / Restrictions Precautions Precautions: Back Precaution Booklet Issued: No Precaution Comments: pt and wife able to recall all back precautions Restrictions Weight Bearing Restrictions: No      Mobility Bed Mobility Overal bed mobility: Needs Assistance Bed Mobility: Rolling;Sidelying to Sit;Sit to Sidelying Rolling: Supervision Sidelying to sit: Supervision     Sit to sidelying: Min assist General bed mobility comments: min A with LEs for log roll technique once pt on his side to roll over on his back in unison  Transfers Overall transfer level: Modified independent Equipment used: None                  Balance Overall balance assessment: No apparent balance deficits (not formally assessed)                                         ADL either performed or assessed with clinical judgement   ADL Overall ADL's :  Needs assistance/impaired Eating/Feeding: Independent;Sitting   Grooming: Wash/dry hands;Wash/dry face;Independent;Oral care;Sitting   Upper Body Bathing: Modified independent;Sitting   Lower Body Bathing: Moderate assistance;With caregiver independent assisting;Adhering to back precautions   Upper Body Dressing : Modified independent   Lower Body Dressing: Moderate assistance;With caregiver independent assisting;Adhering to back precautions   Toilet Transfer: Modified Independent;With caregiver independent assisting;Cueing for safety;Ambulation Toilet Transfer Details (indicate cue type and reason): cueing for back precautions Toileting- Clothing Manipulation and Hygiene: Minimal assistance;With caregiver independent assisting Toileting - Clothing Manipulation Details (indicate cue type and reason): Educated pt and his wife on toileting aid for home use Tub/ Shower Transfer: Modified independent;Ambulation;With caregiver independent assisting   Functional mobility during ADLs: Modified independent;Caregiver able to provide necessary level of assistance General ADL Comments: pt familiar with ADL A/E and DME from previous knee surgeries. Educated pt on toileting aid for home use     Vision Baseline Vision/History: Wears glasses Patient Visual Report: No change from baseline       Perception     Praxis      Pertinent Vitals/Pain Pain Assessment: 0-10 Pain Score: 9  Pain Location: initially rated pan as 5/10 after sitting EOB, later c/o pf R Thigh cramping with back pain at 9/10 Pain Descriptors / Indicators: Aching;Sore;Shooting Pain Intervention(s): Limited activity within patient's tolerance;Monitored during session;Patient requesting pain meds-RN notified;Repositioned     Hand Dominance Right  Extremity/Trunk Assessment Upper Extremity Assessment Upper Extremity Assessment: Overall WFL for tasks assessed       Cervical / Trunk Assessment Cervical / Trunk Assessment:  Normal   Communication Communication Communication: No difficulties   Cognition Arousal/Alertness: Awake/alert Behavior During Therapy: WFL for tasks assessed/performed Overall Cognitive Status: Within Functional Limits for tasks assessed                                     General Comments       Exercises     Shoulder Instructions      Home Living Family/patient expects to be discharged to:: Private residence Living Arrangements: Spouse/significant other Available Help at Discharge: Family Type of Home: House Home Access: Stairs to enter Technical brewer of Steps: 3 Entrance Stairs-Rails: None Home Layout: Two level;Able to live on main level with bedroom/bathroom;Full bath on main level     Bathroom Shower/Tub: Tub/shower unit;Walk-in shower   Bathroom Toilet: Handicapped height     Home Equipment: Clinical cytogeneticist - 2 wheels;Cane - single point          Prior Functioning/Environment Level of Independence: Independent                 OT Problem List: Decreased activity tolerance;Decreased knowledge of use of DME or AE;Pain      OT Treatment/Interventions:      OT Goals(Current goals can be found in the care plan section) Acute Rehab OT Goals Patient Stated Goal: "get pain under control before I go home" OT Goal Formulation: With patient/family  OT Frequency:     Barriers to D/C:    no barriers, wife can assist 24/7       Co-evaluation              AM-PAC OT "6 Clicks" Daily Activity     Outcome Measure Help from another person eating meals?: None Help from another person taking care of personal grooming?: None Help from another person toileting, which includes using toliet, bedpan, or urinal?: None Help from another person bathing (including washing, rinsing, drying)?: A Little Help from another person to put on and taking off regular upper body clothing?: None Help from another person to put on and taking off  regular lower body clothing?: A Little 6 Click Score: 22   End of Session    Activity Tolerance: Patient limited by pain Patient left: in chair;with call bell/phone within reach;with family/visitor present;with nursing/sitter in room  OT Visit Diagnosis: Pain Pain - Right/Left: Right Pain - part of body: Leg(back)                Time: 1031-5945 OT Time Calculation (min): 29 min Charges:  OT General Charges $OT Visit: 1 Visit OT Evaluation $OT Eval Low Complexity: 1 Low OT Treatments $Self Care/Home Management : 8-22 mins    Emmit Alexanders [facility] 03/24/2018, 10:02 AM

## 2018-03-24 NOTE — Progress Notes (Signed)
Patient alert and oriented, mae's well, voiding adequate amount of urine, swallowing without difficulty, no c/o pain at time of discharge. Patient discharged home with family. Script and discharged instructions given to patient. Patient and family stated understanding of instructions given. Patient has an appointment with Dr. Jones °

## 2018-05-03 ENCOUNTER — Other Ambulatory Visit: Payer: Self-pay | Admitting: Family Medicine

## 2018-05-03 MED ORDER — ALLOPURINOL 100 MG PO TABS: 200.0000 mg | ORAL_TABLET | Freq: Every morning | ORAL | 0 refills | Status: DC

## 2018-05-03 NOTE — Telephone Encounter (Signed)
Copied from [place] [id number]. Topic: Quick Communication - Rx Refill/Question >> May 03, 2018  6:08 PM Waylan Rocher, [place] L wrote: Medication: allopurinol (ZYLOPRIM) 100 MG tablet  Has the patient contacted their pharmacy? Yes.   (Agent: If no, request that the patient contact the pharmacy for the refill.) (Agent: If yes, when and what did the pharmacy advise?)  Preferred Pharmacy (with phone number or street name): [facility] [address] [place] - [place] [address] [place] [address]  Phone: [phone] Fax: [phone]  Agent: Please be advised that RX refills may take up to 3 business days. We ask that you follow-up with your pharmacy.

## 2018-06-01 ENCOUNTER — Encounter: Payer: Self-pay | Admitting: Family Medicine

## 2018-06-05 ENCOUNTER — Other Ambulatory Visit: Payer: Self-pay

## 2018-06-05 MED ORDER — COLCHICINE 0.6 MG PO TABS: 0.6000 mg | ORAL_TABLET | Freq: Every day | ORAL | 0 refills | Status: DC | PRN

## 2018-06-23 ENCOUNTER — Telehealth: Payer: Self-pay | Admitting: *Deleted

## 2018-06-23 NOTE — Telephone Encounter (Signed)
Virtual Visit Pre-Appointment Phone Call  "(Name), I am calling you today to discuss your upcoming appointment. We are currently trying to limit exposure to the virus that causes COVID-19 by seeing patients at home rather than in the office."  1. "What is the BEST phone number to call the day of the visit?" - include this in appointment notes  2. "Do you have or have access to (through a family member/friend) a smartphone with video capability that we can use for your visit?" a. If yes - list this number in appt notes as "cell" (if different from BEST phone #) and list the appointment type as a VIDEO visit in appointment notes b. If no - list the appointment type as a PHONE visit in appointment notes  3. Confirm consent - "In the setting of the current Covid19 crisis, you are scheduled for a (phone or video) visit with your provider on (Monday, May 4) at (9:30 am ).  Just as we do with many in-office visits, in order for you to participate in this visit, we must obtain consent.  If you'd like, I can send this to your mychart (if signed up) or email for you to review.  Otherwise, I can obtain your verbal consent now.  All virtual visits are billed to your insurance company just like a normal visit would be.  By agreeing to a virtual visit, we'd like you to understand that the technology does not allow for your provider to perform an examination, and thus may limit your provider's ability to fully assess your condition. If your provider identifies any concerns that need to be evaluated in person, we will make arrangements to do so.  Finally, though the technology is pretty good, we cannot assure that it will always work on either your or our end, and in the setting of a video visit, we may have to convert it to a phone-only visit.  In either situation, we cannot ensure that we have a secure connection.  Are you willing to proceed?" STAFF: Did the patient verbally acknowledge consent to telehealth  visit? Document YES/NO here: YES  4. Advise patient to be prepared - "Two hours prior to your appointment, go ahead and check your blood pressure, pulse, oxygen saturation, and your weight (if you have the equipment to check those) and write them all down. When your visit starts, your provider will ask you for this information. If you have an Apple Watch or Kardia device, please plan to have heart rate information ready on the day of your appointment. Please have a pen and paper handy nearby the day of the visit as well."  5. Give patient instructions for MyChart download to smartphone OR Doximity/Doxy.me as below if video visit (depending on what platform provider is using)  6. Inform patient they will receive a phone call 15 minutes prior to their appointment time (may be from unknown caller ID) so they should be prepared to answer    TELEPHONE CALL NOTE  Nathan Collins has been deemed a candidate for a follow-up tele-health visit to limit community exposure during the Covid-19 pandemic. I spoke with the patient via phone to ensure availability of phone/video source, confirm preferred email & phone number, and discuss instructions and expectations.  I reminded Nathan Collins to be prepared with any vital sign and/or heart rhythm information that could potentially be obtained via home monitoring, at the time of his visit. I reminded Nathan Collins to expect a  phone call prior to his visit.  Madi Bonfiglio Avanell Shackleton 06/23/2018 8:18 AM   INSTRUCTIONS FOR DOWNLOADING THE MYCHART APP TO SMARTPHONE  - The patient must first make sure to have activated MyChart and know their login information - If Apple, go to App Store and type in MyChart in the search bar and download the app. If Android, ask patient to go to [employer] and type in [place] in the search bar and download the app. The app is free but as with any other app downloads, their phone may require them to verify saved payment  information or Apple/Android password.  - The patient will need to then log into the app with their MyChart username and password, and select [facility] as their healthcare provider to link the account. When it is time for your visit, go to the MyChart app, find appointments, and click Begin Video Visit. Be sure to Select Allow for your device to access the Microphone and Camera for your visit. You will then be connected, and your provider will be with you shortly.  **If they have any issues connecting, or need assistance please contact MyChart service desk [phone])**  **If using a computer, in order to ensure the best quality for their visit they will need to use either of the following Internet Browsers: [employer], or Google Chrome**  IF USING DOXIMITY or DOXY.ME - The patient will receive a link just prior to their visit by text.     FULL LENGTH CONSENT FOR TELE-HEALTH VISIT   I hereby voluntarily request, consent and authorize [facility] and its employed or contracted physicians, physician assistants, nurse practitioners or other licensed health care professionals (the Practitioner), to provide me with telemedicine health care services (the "Services") as deemed necessary by the treating Practitioner. I acknowledge and consent to receive the Services by the Practitioner via telemedicine. I understand that the telemedicine visit will involve communicating with the Practitioner through live audiovisual communication technology and the disclosure of certain medical information by electronic transmission. I acknowledge that I have been given the opportunity to request an in-person assessment or other available alternative prior to the telemedicine visit and am voluntarily participating in the telemedicine visit.  I understand that I have the right to withhold or withdraw my consent to the use of telemedicine in the course of my care at any time, without affecting my right  to future care or treatment, and that the Practitioner or I may terminate the telemedicine visit at any time. I understand that I have the right to inspect all information obtained and/or recorded in the course of the telemedicine visit and may receive copies of available information for a reasonable fee.  I understand that some of the potential risks of receiving the Services via telemedicine include:  [name] Delay or interruption in medical evaluation due to technological equipment failure or disruption; . Information transmitted may not be sufficient (e.g. poor resolution of images) to allow for appropriate medical decision making by the Practitioner; and/or  . In rare instances, security protocols could fail, causing a breach of personal health information.  Furthermore, I acknowledge that it is my responsibility to provide information about my medical history, conditions and care that is complete and accurate to the best of my ability. I acknowledge that Practitioner's advice, recommendations, and/or decision may be based on factors not within their control, such as incomplete or inaccurate data provided by me or distortions of diagnostic images or specimens that may result  from electronic transmissions. I understand that the practice of medicine is not an exact science and that Practitioner makes no warranties or guarantees regarding treatment outcomes. I acknowledge that I will receive a copy of this consent concurrently upon execution via email to the email address I last provided but may also request a printed copy by calling the office of [facility].    I understand that my insurance will be billed for this visit.   I have read or had this consent read to me. . I understand the contents of this consent, which adequately explains the benefits and risks of the Services being provided via telemedicine.  . I have been provided ample opportunity to ask questions regarding this consent and the Services  and have had my questions answered to my satisfaction. . I give my informed consent for the services to be provided through the use of telemedicine in my medical care  By participating in this telemedicine visit I agree to the above.

## 2018-06-25 NOTE — Progress Notes (Signed)
Telehealth Visit     Virtual Visit via Video Note   This visit type was conducted due to national recommendations for restrictions regarding the COVID-19 Pandemic (e.g. social distancing) in an effort to limit this patient's exposure and mitigate transmission in our community.  Due to his co-morbid illnesses, this patient is at least at moderate risk for complications without adequate follow up.  This format is felt to be most appropriate for this patient at this time.  All issues noted in this document were discussed and addressed.  A limited physical exam was performed with this format.  Please refer to the patient's chart for his consent to telehealth for [facility]   Evaluation Performed:  Follow-up visit  This visit type was conducted due to national recommendations for restrictions regarding the COVID-19 Pandemic (e.g. social distancing).  This format is felt to be most appropriate for this patient at this time.  All issues noted in this document were discussed and addressed.  No physical exam was performed (except for noted visual exam findings with Video Visits).  Please refer to the patient's chart (MyChart message for video visits and phone note for telephone visits) for the patient's consent to telehealth for [facility].  Date:  06/26/2018   ID:  DIANNE BADY, DOB [birth year], MRN [id number]  Patient Location:  Home  Provider location:   Home  PCP:  Vivi Barrack, MD  Cardiologist:  Servando Snare & No primary care provider on file.  Electrophysiologist:  None   Chief Complaint:  Follow up visit  History of Present Illness:    Nathan Collins is a 68 y.o. male who presents via audio/video conferencing for a telehealth visit today.  He elected to be followed by me. I see his wife - [name].  He has a history of HTN, gout, HLD,OSAand history of melanoma.  I saw him as a new patient back in November of 2019 - his wife had wanted him "checked out" due to multiple  risk factors. He has +FH for CAD. Both parents developed heart disease in their 23's - dad had CHF/MI/multiple stents. Mom with PAD/CHF - she smoked. He has never smoked. He is on CPAP for OSA. We got a coronary CT - he is managed medically for CAD (have discussed with Dr. Irish Lack in the past).   Last seen in January - felt to be doing ok. He was cleared for back surgery after discussion with Dr. Irish Lack in the office. Was to start baby aspirin after his surgery. Statin dose was increased. He was to monitor his BP for me.   The patient does not have symptoms concerning for COVID-19 infection (fever, chills, cough, or new shortness of breath).   Seen today via Doximity video. He has consented for this visit. He is doing well. No real concerns. BP looks good - but he has only checked this one reading. He feels good. No chest pain. Did well with his back surgery. Has been released. No real exercise but trying to move more. He is on low dose baby aspirin now. Also on statin - was to have labs today as well. He is getting ready to return to work with the COVID issues - precautions given to both he and his wife. Tolerating his medicines. Overall, he feels like he is doing ok.   Past Medical History:  Diagnosis Date  . Arthritis   . Cancer (HCC)    HX OF MALIGNANT MELANOMA SHOULDER   . Coronary artery  disease    Medical thearpy, 02/20/18 CT coronary/FFR  . Heart murmur   . History of kidney stones     "A FEW YRS AGO"  . Hypertension   . Sleep apnea    CPAP- 4 GOES TO 12 OR 14   . Thoracic ascending aortic aneurysm (HCC)    4 cm 02/20/18   Past Surgical History:  Procedure Laterality Date  . APPENDECTOMY    . JOINT REPLACEMENT     RIGHT KNEE REPLACEMENT   . KNEE ARTHROSCOPY     LEFT   . LUMBAR LAMINECTOMY/DECOMPRESSION MICRODISCECTOMY Bilateral 03/23/2018   Procedure: Lumbar Three-Four Lumbar Four-Five Laminectomy/Foraminotomy with sublaminar decompression;  Surgeon: [name], MD;   Location: [facility];  Service: Neurosurgery;  Laterality: Bilateral;  Lumbar Three-Four Lumbar Four-Five Laminectomy/Foraminotomy with sublaminar decompression  . RIGHT KNEE SURGERY     . TOTAL KNEE ARTHROPLASTY  12/10/2011   Procedure: TOTAL KNEE ARTHROPLASTY;  Surgeon: [name], MD;  Location: [facility];  Service: Orthopedics;  Laterality: Left;  [name] VASECTOMY       Current Meds  Medication Sig  . allopurinol (ZYLOPRIM) 100 MG tablet Take 2 tablets (200 mg total) by mouth every morning.  [name] aspirin EC 81 MG tablet Take 81 mg by mouth daily.  [name] atorvastatin (LIPITOR) 20 MG tablet Take 1 tablet (20 mg total) by mouth daily.  . Coenzyme Q10 (CO Q 10 PO) Take 200 mg by mouth daily.   . colchicine 0.6 MG tablet Take 1 tablet (0.6 mg total) by mouth daily as needed (gout).  [name] ibuprofen (ADVIL,MOTRIN) 200 MG tablet Take 800 mg by mouth every 4 (four) hours as needed for moderate pain.  [name] losartan (COZAAR) 100 MG tablet Take 1 tablet (100 mg total) by mouth every morning.  . Multiple Vitamin (MULTIVITAMIN) tablet Take 1 tablet by mouth daily.  . sildenafil (REVATIO) 20 MG tablet Take 100 mg by mouth daily as needed (ED).     Allergies:   Indocin [indomethacin]   Social History   Tobacco Use  . Smoking status: Never Smoker  . Smokeless tobacco: Never Used  Substance Use Topics  . Alcohol use: Not Currently  . Drug use: No     Family Hx: The patient's family history includes Arrhythmia in his mother; Bladder Cancer in his mother; Diabetes in his father; Heart failure in his father and mother; Hyperlipidemia in his mother; Peripheral Artery Disease in his mother; Prostate cancer in his father.  ROS:   Please see the history of present illness.   All other systems reviewed are negative.    Objective:    Vital Signs:  BP 135/72   Ht 5\' 7"  (1.702 m)   Wt 210 lb (95.3 kg)   BMI 32.89 kg/m    Wt Readings from Last 3 Encounters:  06/26/18 210 lb (95.3 kg)  03/23/18 207 lb (93.9 kg)   03/16/18 209 lb 6.4 oz (95 kg)    Alert male in no acute distress. Not short of breath with conversation. Appropriate in responses.   Labs/Other Tests and Data Reviewed:    Lab Results  Component Value Date   WBC 7.0 03/16/2018   HGB 13.6 03/16/2018   HCT 39.9 03/16/2018   PLT 271 03/16/2018   GLUCOSE 168 (H) 03/16/2018   CHOL 177 01/16/2018   TRIG 163 (H) 01/16/2018   HDL 44 01/16/2018   LDLCALC 100 (H) 01/16/2018   ALT 34 01/16/2018   AST 19 01/16/2018   NA  141 03/16/2018   K 3.8 03/16/2018   CL 111 03/16/2018   CREATININE 1.23 03/16/2018   BUN 25 (H) 03/16/2018   CO2 20 (L) 03/16/2018   PSA 1.52 02/27/2018   INR 0.97 12/07/2011   HGBA1C 5.9 05/11/2017     BNP (last 3 results) No results for input(s): BNP in the last 8760 hours.  ProBNP (last 3 results) No results for input(s): PROBNP in the last 8760 hours.    Prior CV studies:    The following studies were reviewed today:  Coronary CT 01/2018 FINDINGS: Non-cardiac: See separate report from [facility]. No significant findings on limited lung and soft tissue windows.  Calcium score: Calcium noted in proximal LAD/circumflex and D1  Coronary Arteries: Left dominant with no anomalies  LM: Normal  LAD: Less than 30% calcific plaque proximally 50% calcific plaque prior to take-off of D2  D1: 50% or less calcific stenosis  D2: Less than 50% calcific plaque  Circumflex: Less than 30% calcific stenosis proximally  OM1: Normal  OM2: Normal  PDA: Normal  PLA Normal  RCA: Normal  IMPRESSION: 1. Calcium score 137 which is 53 rd percentile for age and sex  2. Mild aortic root dilatation 4.0 cm  3. CAD worst lesion in mid LAD prior to D2 take off 50% Study will be sent for FFR CT  Jenkins Rouge   Electronically Signed By: Jenkins Rouge M.D. On: 02/20/2018 13:39  FFR FINDINGS: FFR CT is normal in the RCA, LM, LAD. D2 is normal at.90 The most distal aspect  of OM1 is abnormal at.73. The most distal aspect of the left sided  PDA is abnormal at.78  IMPRESSION: FFR CT is abnormal in the most distal aspect of OM1 and distal left sided PDA. Would consider medical Rx initially as abnormalities only in the most Distal aspect of small vessels   Electronically Signed By: Jenkins Rouge M.D. On: 02/21/2018 08:03  Echo Study Conclusions 12/2017  - Left ventricle: The cavity size was normal. There was mild concentric hypertrophy. Systolic function was normal. The estimated ejection fraction was in the range of 60% to 65%. Wall motion was normal; there were no regional wall motion abnormalities. Doppler parameters are consistent with abnormal left ventricular relaxation (grade 1 diastolic dysfunction). Doppler parameters are consistent with indeterminate ventricular filling pressure. - Aortic valve: Transvalvular velocity was within the normal range. There was no stenosis. There was no regurgitation. - Aorta: Ascending aortic diameter: 41 mm (S). - Ascending aorta: The ascending aorta was mildly dilated. - Mitral valve: Transvalvular velocity was within the normal range. There was no evidence for stenosis. There was no regurgitation. - Left atrium: The atrium was mildly dilated. - Right ventricle: The cavity size was normal. Wall thickness was normal. Systolic function was normal. - Pulmonary arteries: Systolic pressure was within the normal range. - Global longitudinal strain -19.3% (normal).   ASSESSMENT & PLAN:    1. Abnormal coronary CT - he has CAD - primarily in the branches of the LAD - FFR was abnormal - but being in the distal areas - medical therapy was recommended -  He remains on aspirin and statin therapy. CV risk factor modification continues to be the focus - he has no symptoms.   2. HTN - BP looks good today - I have asked him to monitor.   3. HLD - statin was increased at his last visit  - LDL goal is less than 70 - will plan on checking lab next month.  4. +FH for CAD - needs CV risk factor modification.   5. Dilated aorta - will need annual imaging - due 01/2019. He is aware.   6. Prior back surgery - seems to have done well.    7. COVID-19 Education: The signs and symptoms of COVID-19 were discussed with the patient and how to seek care for testing (follow up with PCP or arrange E-visit).  The importance of social distancing, staying at home, hand hygiene and wearing a mask when out in public were discussed today.  Patient Risk:   After full review of this patient's clinical status, I feel that they are at least moderate risk at this time.  Time:   Today, I have spent 15 minutes with the patient with telehealth technology discussing the above issues.     Medication Adjustments/Labs and Tests Ordered: Current medicines are reviewed at length with the patient today.  Concerns regarding medicines are outlined above.   Tests Ordered: Orders Placed This Encounter  Procedures  . Basic metabolic panel  . Hepatic function panel  . Lipid panel    Medication Changes: No orders of the defined types were placed in this encounter.   Disposition:  FU with me after CTA of the chest - with fasting labs on return.     Patient is agreeable to this plan and will call if any problems develop in the interim.   Amie Critchley, NP  06/26/2018 9:44 AM    [place]

## 2018-06-26 ENCOUNTER — Telehealth: Payer: Self-pay | Admitting: *Deleted

## 2018-06-26 ENCOUNTER — Other Ambulatory Visit: Payer: Self-pay

## 2018-06-26 ENCOUNTER — Encounter: Payer: Self-pay | Admitting: Nurse Practitioner

## 2018-06-26 ENCOUNTER — Telehealth (INDEPENDENT_AMBULATORY_CARE_PROVIDER_SITE_OTHER): Payer: Medicare Other | Admitting: Nurse Practitioner

## 2018-06-26 ENCOUNTER — Other Ambulatory Visit: Payer: Self-pay | Admitting: *Deleted

## 2018-06-26 VITALS — BP 135/72 | Ht 67.0 in | Wt 210.0 lb

## 2018-06-26 DIAGNOSIS — I259 Chronic ischemic heart disease, unspecified: Secondary | ICD-10-CM

## 2018-06-26 DIAGNOSIS — I10 Essential (primary) hypertension: Secondary | ICD-10-CM

## 2018-06-26 DIAGNOSIS — E7849 Other hyperlipidemia: Secondary | ICD-10-CM

## 2018-06-26 DIAGNOSIS — Z7189 Other specified counseling: Secondary | ICD-10-CM

## 2018-06-26 DIAGNOSIS — I7789 Other specified disorders of arteries and arterioles: Secondary | ICD-10-CM

## 2018-06-26 NOTE — Telephone Encounter (Signed)
S/w is aware of all apts.

## 2018-06-26 NOTE — Patient Instructions (Addendum)
After Visit Summary:  We will be checking the following labs today - NONE  Let's do fasting lipids and liver studies in a month    Medication Instructions:    Continue with your current medicines.    If you need a refill on your cardiac medications before your next appointment, please call your pharmacy.     Testing/Procedures To Be Arranged:  Let's plan to do a CTA of the chest to follow up the dilatation of the aorta in December of 2020  Follow-Up:   See me after the CTA in December 2020/January 2021 - we will do fasting labs at that time.     At [facility], you and your health needs are our priority.  As part of our continuing mission to provide you with exceptional heart care, we have created designated Provider Care Teams.  These Care Teams include your primary Cardiologist (physician) and Advanced Practice Providers (APPs -  Physician Assistants and Nurse Practitioners) who all work together to provide you with the care you need, when you need it.  Special Instructions:  . Stay safe, stay home, wash your hands for at least 20 seconds and wear a mask when out in public.  . It was good to talk with you today.   [name] Keep a check on your blood pressure for me. . Daily exercise encouraged.    Call the [place] office at [phone] if you have any questions, problems or concerns.

## 2018-07-01 ENCOUNTER — Other Ambulatory Visit: Payer: Self-pay | Admitting: Family Medicine

## 2018-07-09 ENCOUNTER — Other Ambulatory Visit: Payer: Self-pay | Admitting: Family Medicine

## 2018-07-31 ENCOUNTER — Other Ambulatory Visit: Payer: Medicare Other | Admitting: *Deleted

## 2018-07-31 ENCOUNTER — Other Ambulatory Visit: Payer: Medicare Other

## 2018-07-31 ENCOUNTER — Telehealth: Payer: Self-pay

## 2018-07-31 ENCOUNTER — Other Ambulatory Visit: Payer: Self-pay

## 2018-07-31 DIAGNOSIS — E7849 Other hyperlipidemia: Secondary | ICD-10-CM

## 2018-07-31 DIAGNOSIS — I259 Chronic ischemic heart disease, unspecified: Secondary | ICD-10-CM | POA: Diagnosis not present

## 2018-07-31 DIAGNOSIS — I10 Essential (primary) hypertension: Secondary | ICD-10-CM

## 2018-07-31 LAB — BASIC METABOLIC PANEL
BUN/Creatinine Ratio: 22 (ref 10–24)
BUN: 33 mg/dL — ABNORMAL HIGH (ref 8–27)
CO2: 21 mmol/L (ref 20–29)
Calcium: 9.7 mg/dL (ref 8.6–10.2)
Chloride: 106 mmol/L (ref 96–106)
Creatinine, Ser: 1.47 mg/dL — ABNORMAL HIGH (ref 0.76–1.27)
GFR calc Af Amer: 56 mL/min/{1.73_m2} — ABNORMAL LOW (ref >59)
GFR calc non Af Amer: 48 mL/min/{1.73_m2} — ABNORMAL LOW (ref >59)
Glucose: 113 mg/dL — ABNORMAL HIGH (ref 65–99)
Potassium: 4.7 mmol/L (ref 3.5–5.2)
Sodium: 143 mmol/L (ref 134–144)

## 2018-07-31 LAB — HEPATIC FUNCTION PANEL
ALT: 22 IU/L (ref 0–44)
AST: 23 IU/L (ref 0–40)
Albumin: 4.6 g/dL (ref 3.8–4.8)
Alkaline Phosphatase: 93 IU/L (ref 39–117)
Bilirubin Total: 0.5 mg/dL (ref 0.0–1.2)
Bilirubin, Direct: 0.16 mg/dL (ref 0.00–0.40)
Total Protein: 6.7 g/dL (ref 6.0–8.5)

## 2018-07-31 LAB — LIPID PANEL
Chol/HDL Ratio: 3.2 ratio (ref 0.0–5.0)
Cholesterol, Total: 155 mg/dL (ref 100–199)
HDL: 48 mg/dL (ref >39)
LDL Calculated: 91 mg/dL (ref 0–99)
Triglycerides: 79 mg/dL (ref 0–149)
VLDL Cholesterol Cal: 16 mg/dL (ref 5–40)

## 2018-07-31 NOTE — Telephone Encounter (Signed)
Notes recorded by Frederik Schmidt, RN on 07/31/2018 at 5:04 PM EDT The patient has been notified of the result and verbalized understanding. All questions (if any) were answered. Frederik Schmidt, RN 07/31/2018 5:04 PM

## 2018-07-31 NOTE — Telephone Encounter (Signed)
-----   Message from Burtis Junes, NP sent at 07/31/2018  4:58 PM EDT ----- Please call - see if he's been taking lots of NSAID - his renal function is elevated - if he is taking more - he needs to stop it.  Lipids look great as well as liver studies.  BMET in one month please.

## 2018-08-01 ENCOUNTER — Other Ambulatory Visit: Payer: Self-pay | Admitting: Family Medicine

## 2018-08-30 ENCOUNTER — Other Ambulatory Visit: Payer: Self-pay

## 2018-08-30 ENCOUNTER — Other Ambulatory Visit: Payer: Medicare Other

## 2018-08-30 DIAGNOSIS — I10 Essential (primary) hypertension: Secondary | ICD-10-CM | POA: Diagnosis not present

## 2018-08-30 LAB — BASIC METABOLIC PANEL
BUN/Creatinine Ratio: 19 (ref 10–24)
BUN: 23 mg/dL (ref 8–27)
CO2: 23 mmol/L (ref 20–29)
Calcium: 9.7 mg/dL (ref 8.6–10.2)
Chloride: 102 mmol/L (ref 96–106)
Creatinine, Ser: 1.2 mg/dL (ref 0.76–1.27)
GFR calc Af Amer: 71 mL/min/{1.73_m2} (ref >59)
GFR calc non Af Amer: 62 mL/min/{1.73_m2} (ref >59)
Glucose: 94 mg/dL (ref 65–99)
Potassium: 4.5 mmol/L (ref 3.5–5.2)
Sodium: 140 mmol/L (ref 134–144)

## 2018-11-02 ENCOUNTER — Other Ambulatory Visit: Payer: Self-pay | Admitting: Family Medicine

## 2018-11-06 DIAGNOSIS — G4733 Obstructive sleep apnea (adult) (pediatric): Secondary | ICD-10-CM | POA: Diagnosis not present

## 2018-11-10 DIAGNOSIS — H35372 Puckering of macula, left eye: Secondary | ICD-10-CM | POA: Diagnosis not present

## 2018-11-10 DIAGNOSIS — H43813 Vitreous degeneration, bilateral: Secondary | ICD-10-CM | POA: Diagnosis not present

## 2018-11-10 DIAGNOSIS — H35033 Hypertensive retinopathy, bilateral: Secondary | ICD-10-CM | POA: Diagnosis not present

## 2018-11-10 DIAGNOSIS — H2513 Age-related nuclear cataract, bilateral: Secondary | ICD-10-CM | POA: Diagnosis not present

## 2018-11-10 DIAGNOSIS — H25013 Cortical age-related cataract, bilateral: Secondary | ICD-10-CM | POA: Diagnosis not present

## 2018-11-20 ENCOUNTER — Other Ambulatory Visit: Payer: Self-pay | Admitting: Family Medicine

## 2018-11-20 MED ORDER — LOSARTAN POTASSIUM 100 MG PO TABS: 100.0000 mg | ORAL_TABLET | Freq: Every morning | ORAL | 1 refills | Status: DC

## 2018-11-20 NOTE — Telephone Encounter (Signed)
Requested medication (s) are due for refill today: yes  Requested medication (s) are on the active medication list: yes  Last refill:  12/23/2017  Future visit scheduled: no  Notes to clinic: review for refill   Requested Prescriptions  Pending Prescriptions Disp Refills   losartan (COZAAR) 100 MG tablet 90 tablet 1    Sig: Take 1 tablet (100 mg total) by mouth every morning.     Cardiovascular:  Angiotensin Receptor Blockers Failed - 11/20/2018  9:08 AM      Failed - Valid encounter within last 6 months    Recent Outpatient Visits          8 months ago Gout, unspecified cause, unspecified chronicity, unspecified site   [place] Parker, Algis Greenhouse, MD   1 year ago Gout, unspecified cause, unspecified chronicity, unspecified site   [place] [facility], Algis Greenhouse, MD      Future Appointments            In 2 months Servando Snare, Marlane Hatcher, NP [place], [facility]           Passed - Cr in normal range and within 180 days    Creatinine, Ser  Date Value Ref Range Status  08/30/2018 1.20 0.76 - 1.27 mg/dL Final         Passed - K in normal range and within 180 days    Potassium  Date Value Ref Range Status  08/30/2018 4.5 3.5 - 5.2 mmol/L Final         Passed - Patient is not pregnant      Passed - Last BP in normal range    BP Readings from Last 1 Encounters:  06/26/18 135/72

## 2018-11-20 NOTE — Telephone Encounter (Signed)
Medication: losartan (COZAAR) 100 MG tablet [id number]  Request 90 days  Has the patient contacted their pharmacy? Yes  (Agent: If no, request that the patient contact the pharmacy for the refill.) (Agent: If yes, when and what did the pharmacy advise?)  Preferred Pharmacy (with phone number or street name): [facility] [address], [place] [phone] (Phone) [phone] (Fax)    Agent: Please be advised that RX refills may take up to 3 business days. We ask that you follow-up with your pharmacy.

## 2018-11-20 NOTE — Telephone Encounter (Signed)
See request °

## 2018-12-12 ENCOUNTER — Other Ambulatory Visit: Payer: Self-pay

## 2018-12-12 ENCOUNTER — Encounter: Payer: Self-pay | Admitting: Family Medicine

## 2018-12-12 ENCOUNTER — Ambulatory Visit (INDEPENDENT_AMBULATORY_CARE_PROVIDER_SITE_OTHER): Payer: Medicare Other

## 2018-12-12 DIAGNOSIS — Z23 Encounter for immunization: Secondary | ICD-10-CM

## 2018-12-13 ENCOUNTER — Ambulatory Visit: Payer: Self-pay

## 2018-12-13 NOTE — Telephone Encounter (Signed)
Patient called stating that today at work he fell while stepping down from a ladder onto a car and his left arm went through the window.  He states he received multiple small lacerations that he has cleansed and bandaged. Per his wife some spots are still bleeding. He states that he can move the arm freely. He rates pain at 1.  He states that he sustained no other injuries with the fall. He is unsure when he had his tetanus booster. Care advice read to patient.  He verbalized understanding. Call was transferred to office for scheduling.  Reason for Disposition . [1] No prior tetanus shots (or is not fully vaccinated) AND [2] any wound (e.g., cut or scrape)  Answer Assessment - Initial Assessment Questions 1. MECHANISM: "How did the injury happen?"     Today climbing ladder fell on car 2. ONSET: "When did the injury happen?" (Minutes or hours ago)      today 3. LOCATION: "Where is the injury located?"      forarm 4. APPEARANCE of INJURY: "What does the injury look like?"      Mult small laceration 5. SEVERITY: "Can you use the arm normally?"      Able to use arm 6. SWELLING or BRUISING: "is there any swelling or bruising?" If so, ask: "How large is it? (e.g., inches, centimeters)      none 7. PAIN: "Is there pain?" If so, ask: "How bad is the pain?"    (Scale 1-10; or mild, moderate, severe)     1 8. TETANUS: For any breaks in the skin, ask: "When was the last tetanus booster?"    unsure 9. OTHER SYMPTOMS: "Do you have any other symptoms?"  (e.g., numbness in hand)     no 10. PREGNANCY: "Is there any chance you are pregnant?" "When was your last menstrual period?"     N/A  Protocols used: ARM INJURY-A-AH

## 2018-12-13 NOTE — Telephone Encounter (Signed)
See note

## 2018-12-13 NOTE — Telephone Encounter (Signed)
Notified patient that no records seen of TDAP

## 2018-12-14 ENCOUNTER — Encounter: Payer: Self-pay | Admitting: Family Medicine

## 2018-12-14 ENCOUNTER — Ambulatory Visit (INDEPENDENT_AMBULATORY_CARE_PROVIDER_SITE_OTHER): Payer: Medicare Other | Admitting: Family Medicine

## 2018-12-14 ENCOUNTER — Other Ambulatory Visit: Payer: Self-pay

## 2018-12-14 VITALS — BP 144/78 | HR 74 | Temp 98.4°F | Ht 67.0 in | Wt 225.2 lb

## 2018-12-14 DIAGNOSIS — M79602 Pain in left arm: Secondary | ICD-10-CM | POA: Diagnosis not present

## 2018-12-14 DIAGNOSIS — I10 Essential (primary) hypertension: Secondary | ICD-10-CM

## 2018-12-14 NOTE — Progress Notes (Signed)
   Chief Complaint:  Nathan Collins is a 68 y.o. male who presents today with a chief complaint of arm pain.   Assessment/Plan:  Left arm pain/multiple abrasions Area bandaged today with Dermabond and ABD pad.  No deep lacerations that would be amenable to suturing.  No areas concerning for infection.  Is up-to-date on tetanus.  Discussed wound care management.  Follow-up as needed.  Essential hypertension Above goal.  Likely due to acute pain.  Continue home monitoring goal 140/90.  Continue losartan 100 mg daily.  Follow-up in 3 months.    Subjective:  HPI:  Yesterday patient was at work when he slipped and his left arm went through a car window.  Has several small lacerations to left arm.  Still has some areas of bleeding.  Tetanus vaccine given 2 years ago.  No redness or swelling.  No treatments tried.  Pain seems to be improving.  No other obvious aggravating or alleviating factors.  ROS: Per HPI  PMH: He reports that he has never smoked. He has never used smokeless tobacco. He reports previous alcohol use. He reports that he does not use drugs.      Objective:  Physical Exam: BP (!) 144/78   Pulse 74   Temp 98.4 F (36.9 C)   Ht 5\' 7"  (1.702 m)   Wt 225 lb 3.2 oz (102.2 kg)   SpO2 97%   BMI 35.27 kg/m   Gen: NAD, resting comfortably MSK: Left arm with several shallow abrasions.  No deep lacerations noted.  No surrounding erythema.  No evidence of foreign bodies.     Algis Greenhouse. Jerline Pain, MD 28-Dec-2018 12:30 PM

## 2018-12-14 NOTE — Assessment & Plan Note (Addendum)
Above goal.  Likely due to acute pain.  Continue home monitoring goal 140/90.  Continue losartan 100 mg daily.  Follow-up in 3 months.

## 2018-12-14 NOTE — Patient Instructions (Signed)
It was very nice to see you today!  Please put tissue adhesive on your wound today.  This should fall off in the next 7 to 10 days.  If you have any signs concerning for infection such as redness, pain, swelling, or drainage, please let me know.  Please come back to see me in a few months for your annual checkup with blood work, or sooner if needed.  Take care, Dr Jerline Pain   Tissue Adhesive Wound Care Some cuts and wounds can be closed with skin glue (tissue adhesive). Skin glue holds the skin together and helps your wound heal faster. Skin glue goes away on its own as your wound gets better. Follow these instructions at home:  Wound care  Showers are allowed 24 hours after treatment. Do not soak the wound in water. Do not take baths, swim, or use hot tubs. Do not use soaps or creams on your wound.  If a bandage (dressing) was put on the wound: ? Wash your hands with soap and water before you change your bandage. ? Change the bandage as often as told by your doctor. ? Leave skin glue in place. It will fall off on its own after 7-10 days. ? Keep the bandage dry.  Do not scratch, rub, or pick at the skin glue.  Do not put tape over the skin glue. The skin glue could come off when you take the tape off.  Protect the wound from another injury.  Protect the wound from sun and tanning beds. General instructions  Take over-the-counter and prescription medicines only as told by your doctor.  Keep all follow-up visits as told by your doctor. This is important. Get help right away if:  Your wound is red, puffy (swollen), hot, or tender.  You get a rash after the glue is put on.  You have more pain in the wound.  You have a red streak going away from the wound.  You have yellowish-white fluid (pus) coming from the wound.  You have more bleeding.  You have a fever.  You have chills and you start to shake.  You notice a bad smell coming from the wound.  Your wound or skin  glue breaks open. This information is not intended to replace advice given to you by your health care provider. Make sure you discuss any questions you have with your health care provider. Document Released: 11/18/2007 Document Revised: 01/21/2017 Document Reviewed: 01/02/2016 Elsevier Patient Education  2020 [employer].

## 2019-01-01 ENCOUNTER — Other Ambulatory Visit: Payer: Self-pay | Admitting: Nurse Practitioner

## 2019-01-01 ENCOUNTER — Telehealth: Payer: Self-pay | Admitting: *Deleted

## 2019-01-01 DIAGNOSIS — I712 Thoracic aortic aneurysm, without rupture, unspecified: Secondary | ICD-10-CM

## 2019-01-01 NOTE — Telephone Encounter (Signed)
S/w pt is aware of time and location change on CT scan.  This was precerted.

## 2019-01-01 NOTE — Telephone Encounter (Signed)
Called patient about upcoming appt on 12/7 left message to call back. [phone]

## 2019-01-11 ENCOUNTER — Telehealth: Payer: Self-pay | Admitting: Family Medicine

## 2019-01-11 NOTE — Telephone Encounter (Signed)
I left a message with patient's wife asking to call and schedule Medicare AWV for them both with Loma Sousa ([place]  If patient calls back, please schedule Medicare Wellness Visit at next available opening.  Last AWV 11/10/16 VDM ([place])

## 2019-01-16 ENCOUNTER — Ambulatory Visit: Payer: Medicare Other | Admitting: Nurse Practitioner

## 2019-01-22 ENCOUNTER — Other Ambulatory Visit: Payer: Self-pay

## 2019-01-22 ENCOUNTER — Other Ambulatory Visit: Payer: Medicare Other | Admitting: *Deleted

## 2019-01-22 DIAGNOSIS — E7849 Other hyperlipidemia: Secondary | ICD-10-CM

## 2019-01-22 DIAGNOSIS — I10 Essential (primary) hypertension: Secondary | ICD-10-CM | POA: Diagnosis not present

## 2019-01-22 LAB — LIPID PANEL
Chol/HDL Ratio: 3.7 ratio (ref 0.0–5.0)
Cholesterol, Total: 167 mg/dL (ref 100–199)
HDL: 45 mg/dL (ref >39)
LDL Chol Calc (NIH): 103 mg/dL — ABNORMAL HIGH (ref 0–99)
Triglycerides: 101 mg/dL (ref 0–149)
VLDL Cholesterol Cal: 19 mg/dL (ref 5–40)

## 2019-01-22 LAB — BASIC METABOLIC PANEL
BUN/Creatinine Ratio: 20 (ref 10–24)
BUN: 25 mg/dL (ref 8–27)
CO2: 23 mmol/L (ref 20–29)
Calcium: 9.2 mg/dL (ref 8.6–10.2)
Chloride: 100 mmol/L (ref 96–106)
Creatinine, Ser: 1.22 mg/dL (ref 0.76–1.27)
GFR calc Af Amer: 70 mL/min/{1.73_m2} (ref >59)
GFR calc non Af Amer: 61 mL/min/{1.73_m2} (ref >59)
Glucose: 115 mg/dL — ABNORMAL HIGH (ref 65–99)
Potassium: 4.1 mmol/L (ref 3.5–5.2)
Sodium: 138 mmol/L (ref 134–144)

## 2019-01-22 LAB — HEPATIC FUNCTION PANEL
ALT: 35 IU/L (ref 0–44)
AST: 27 IU/L (ref 0–40)
Albumin: 4.6 g/dL (ref 3.8–4.8)
Alkaline Phosphatase: 94 IU/L (ref 39–117)
Bilirubin Total: 0.6 mg/dL (ref 0.0–1.2)
Bilirubin, Direct: 0.15 mg/dL (ref 0.00–0.40)
Total Protein: 6.5 g/dL (ref 6.0–8.5)

## 2019-01-29 ENCOUNTER — Other Ambulatory Visit: Payer: Self-pay

## 2019-01-29 ENCOUNTER — Inpatient Hospital Stay: Admission: RE | Admit: 2019-01-29 | Payer: Medicare Other | Source: Ambulatory Visit

## 2019-01-29 ENCOUNTER — Ambulatory Visit (HOSPITAL_COMMUNITY)
Admission: RE | Admit: 2019-01-29 | Discharge: 2019-01-29 | Disposition: A | Discharge status: Home / Self Care | Payer: Medicare Other | Source: Ambulatory Visit | Attending: Nurse Practitioner | Admitting: Nurse Practitioner

## 2019-01-29 DIAGNOSIS — I712 Thoracic aortic aneurysm, without rupture, unspecified: Secondary | ICD-10-CM

## 2019-01-29 MED ORDER — IOHEXOL 350 MG/ML SOLN
100.0000 mL | Freq: Once | INTRAVENOUS | Status: AC | PRN
  Administered 2019-01-29: 100 mL via INTRAVENOUS

## 2019-02-03 NOTE — Progress Notes (Signed)
CARDIOLOGY OFFICE NOTE  Date:  02/05/2019    Nathan Collins Date of Birth: 1950-06-14 Medical Record [id number]  PCP:  Vivi Barrack, MD  Cardiologist:  Servando Snare  Chief Complaint  Patient presents with  . Follow-up    History of Present Illness: Nathan Collins is a 68 y.o. male who presents today for a follow up visit.  He elected to be followed by me. I see his wife - [name].  He has a history of HTN, gout, HLD,OSAand history of melanoma.  I saw him as a new patient back in November of 2019 - his wife had wanted him "checked out" due to multiple risk factors. He has+FH for CAD. Both parents developed heart disease in their 14's - dad had CHF/MI/multiple stents. Mom with PAD/CHF - she smoked. He has never smoked.He is on CPAP for OSA. We got a coronary CT - he is managed medically for CAD (have discussed with Dr. Irish Lack in the past).   Last seen in the office back in January - felt to be doing ok. He was cleared for back surgery after discussion with Dr. Irish Lack in the office. Was to start baby aspirin after his surgery. Statin dose was increased. He was to monitor his BP for me. We did a telehealth visit back in May - he was doing well. Trying to get back to exercise following his back surgery.    The patient does not have symptoms concerning for COVID-19 infection (fever, chills, cough, or new shortness of breath).   Comes in today. Here alone. We did labs about 2 weeks ago - LDL not ideal - goal is less than 70. He is trying to do better with his diet/weight - notes the "COVID pounds" but weight is going back down. May get some extra salt on occasion. No chest pain. breathing is good. Not checking his BP at home. Does have a cuff. BP little high here today and he has had his medicines. He has had his follow up scan for his thoracic aneurysm - this was stable.     Past Medical History:  Diagnosis Date  . Arthritis   . Cancer (HCC)    HX OF MALIGNANT  MELANOMA SHOULDER   . Coronary artery disease    Medical thearpy, 02/20/18 CT coronary/FFR  . Heart murmur   . History of kidney stones     "A FEW YRS AGO"  . Hypertension   . Sleep apnea    CPAP- 4 GOES TO 12 OR 14   . Thoracic ascending aortic aneurysm (HCC)    4 cm 02/20/18    Past Surgical History:  Procedure Laterality Date  . APPENDECTOMY    . JOINT REPLACEMENT     RIGHT KNEE REPLACEMENT   . KNEE ARTHROSCOPY     LEFT   . LUMBAR LAMINECTOMY/DECOMPRESSION MICRODISCECTOMY Bilateral 03/23/2018   Procedure: Lumbar Three-Four Lumbar Four-Five Laminectomy/Foraminotomy with sublaminar decompression;  Surgeon: [name], MD;  Location: [facility];  Service: Neurosurgery;  Laterality: Bilateral;  Lumbar Three-Four Lumbar Four-Five Laminectomy/Foraminotomy with sublaminar decompression  . RIGHT KNEE SURGERY     . TOTAL KNEE ARTHROPLASTY  12/10/2011   Procedure: TOTAL KNEE ARTHROPLASTY;  Surgeon: [name], MD;  Location: [facility];  Service: Orthopedics;  Laterality: Left;  [name] VASECTOMY       Medications: Current Meds  Medication Sig  . allopurinol (ZYLOPRIM) 100 MG tablet TAKE 2 TABLETS BY MOUTH EVERY MORNING  . amoxicillin (AMOXIL) 500  MG capsule amoxicillin 500 mg capsule  TAKE 4 CAPSULES BY MOUTH 1 HOUR PRIOR TO DENTAL APT  . aspirin EC 81 MG tablet Take 81 mg by mouth daily.  [name] atorvastatin (LIPITOR) 20 MG tablet Take 1 tablet (20 mg total) by mouth daily.  . Coenzyme Q10 (CO Q 10 PO) Take 200 mg by mouth daily.   [name] COLCRYS 0.6 MG tablet TAKE 1 TABLET (0.6 MG TOTAL) BY MOUTH DAILY AS NEEDED (GOUT).  [name] losartan (COZAAR) 100 MG tablet Take 1 tablet (100 mg total) by mouth every morning.  . Multiple Vitamin (MULTIVITAMIN) tablet Take 1 tablet by mouth daily.  . sildenafil (REVATIO) 20 MG tablet Take 100 mg by mouth daily as needed (ED).     Allergies: Allergies  Allergen Reactions  . Indocin [Indomethacin] Hives and Other (See Comments)    No reaction to other NSAIDs.       Social History: The patient  reports that he has never smoked. He has never used smokeless tobacco. He reports previous alcohol use. He reports that he does not use drugs.   Family History: The patient's family history includes Arrhythmia in his mother; Bladder Cancer in his mother; Diabetes in his father; Heart failure in his father and mother; Hyperlipidemia in his mother; Peripheral Artery Disease in his mother; Prostate cancer in his father.   Review of Systems: Please see the history of present illness.   All other systems are reviewed and negative.   Physical Exam: VS:  BP (!) 158/86   Pulse 65   Ht 5' 7.5" (1.715 m)   Wt 218 lb 12.8 oz (99.2 kg)   BMI 33.76 kg/m  .  BMI Body mass index is 33.76 kg/m.  Wt Readings from Last 3 Encounters:  02/05/19 218 lb 12.8 oz (99.2 kg)  12/14/18 225 lb 3.2 oz (102.2 kg)  06/26/18 210 lb (95.3 kg)   BP recheck by me is 150/90.  General: Pleasant. Well developed, well nourished and in no acute distress.   HEENT: Normal.  Neck: Supple, no JVD, carotid bruits, or masses noted.  Cardiac: Regular rate and rhythm. No murmurs, rubs, or gallops. No edema.  Respiratory:  Lungs are clear to auscultation bilaterally with normal work of breathing.  GI: Soft and nontender.  MS: No deformity or atrophy. Gait and ROM intact.  Skin: Warm and dry. Color is normal.  Neuro:  Strength and sensation are intact and no gross focal deficits noted.  Psych: Alert, appropriate and with normal affect.   LABORATORY DATA:  EKG:  EKG is ordered today. This demonstrates NSR - no acute changes.  Lab Results  Component Value Date   WBC 7.0 03/16/2018   HGB 13.6 03/16/2018   HCT 39.9 03/16/2018   PLT 271 03/16/2018   GLUCOSE 115 (H) 01/22/2019   CHOL 167 01/22/2019   TRIG 101 01/22/2019   HDL 45 01/22/2019   LDLCALC 103 (H) 01/22/2019   ALT 35 01/22/2019   AST 27 01/22/2019   NA 138 01/22/2019   K 4.1 01/22/2019   CL 100 01/22/2019   CREATININE  1.22 01/22/2019   BUN 25 01/22/2019   CO2 23 01/22/2019   PSA 1.52 02/27/2018   INR 0.97 12/07/2011   HGBA1C 5.9 05/11/2017     BNP (last 3 results) No results for input(s): BNP in the last 8760 hours.  ProBNP (last 3 results) No results for input(s): PROBNP in the last 8760 hours.   Other Studies Reviewed Today:  CTA  CHEST IMPRESSION 01/2019: 1. Stable mild uncomplicated fusiform aneurysmal dilatation of the ascending thoracic aorta measuring 40 mm in diameter. Recommend annual imaging followup by CTA or MRA. This recommendation follows 2010 ACCF/AHA/AATS/ACR/ASA/SCA/SCAI/SIR/STS/SVM Guidelines for the Diagnosis and Management of Patients with Thoracic Aortic Disease. Circulation. 2010; [id number]. Aortic aneurysm NOS (ICD10-I71.9) 2. Coronary calcifications.  Aortic Atherosclerosis (ICD10-I70.0).   Electronically Signed   By: Sandi Mariscal M.D.   On: 01/30/2019 09:14  Echo Study Conclusions 12/2017  - Left ventricle: The cavity size was normal. There was mild   concentric hypertrophy. Systolic function was normal. The   estimated ejection fraction was in the range of 60% to 65%. Wall   motion was normal; there were no regional wall motion   abnormalities. Doppler parameters are consistent with abnormal   left ventricular relaxation (grade 1 diastolic dysfunction).   Doppler parameters are consistent with indeterminate ventricular   filling pressure. - Aortic valve: Transvalvular velocity was within the normal range.   There was no stenosis. There was no regurgitation. - Aorta: Ascending aortic diameter: 41 mm (S). - Ascending aorta: The ascending aorta was mildly dilated. - Mitral valve: Transvalvular velocity was within the normal range.   There was no evidence for stenosis. There was no regurgitation. - Left atrium: The atrium was mildly dilated. - Right ventricle: The cavity size was normal. Wall thickness was   normal. Systolic function was normal. -  Pulmonary arteries: Systolic pressure was within the normal   range. - Global longitudinal strain -19.3% (normal).   CT FFR 01/2018 IMPRESSION: FFR CT is abnormal in the most distal aspect of OM1 and distal left sided PDA. Would consider medical Rx initially as abnormalities only in the most  Distal aspect of small vessels   CT CORONARY IMPRESSION 01/2018: 1.  Calcium score 137 which is 53 rd percentile for age and sex  2.  Mild aortic root dilatation 4.0 cm  3. CAD worst lesion in mid LAD prior to D2 take off 50% Study will be sent for [facility] CT  [facility]   Assessment & Plan:  1. CAD - noted by prior coronary CT - primarily in the branches of the LAD - FFR was abnormal but being in the distal areas - medical management recommended - he has no active symptoms. Needs aggressive CV risk factor modification.   2. HTN - not ideal here today - he wants to try checking at home and then send me a diary of his readings. Would add Norvasc 5 mg daily if not controlled.   3. Dilated aorta - recent study noted - will need follow up in one year. Precautions given again today.   4. HLD -  On statin - LDL not ideal - he is wanting to continue to work on lifestyle changes. Recheck in 6 months.   5. Obesity - he knows what he needs to be doing.   6. COVID-19 Education: The signs and symptoms of COVID-19 were discussed with the patient and how to seek care for testing (follow up with PCP or arrange E-visit).  The importance of social distancing, staying at home, hand hygiene and wearing a mask when out in public were discussed today.  Current medicines are reviewed with the patient today.  The patient does not have concerns regarding medicines other than what has been noted above.  The following changes have been made:  See above.  Labs/ tests ordered today include:    Orders Placed This Encounter  Procedures  .  Hepatic function panel  . Lipid Profile  . EKG 12-Lead      Disposition:   FU with me in 1 year.   Patient is agreeable to this plan and will call if any problems develop in the interim.   SignedTruitt Merle, NP  02/05/2019 8:36 AM  [facility] [address] [place], [place]  [address] Phone: [phone] Fax: [phone]

## 2019-02-05 ENCOUNTER — Other Ambulatory Visit: Payer: Self-pay | Admitting: Family Medicine

## 2019-02-05 ENCOUNTER — Ambulatory Visit (INDEPENDENT_AMBULATORY_CARE_PROVIDER_SITE_OTHER): Payer: Medicare Other | Admitting: Nurse Practitioner

## 2019-02-05 ENCOUNTER — Other Ambulatory Visit: Payer: Self-pay

## 2019-02-05 ENCOUNTER — Encounter: Payer: Self-pay | Admitting: Nurse Practitioner

## 2019-02-05 VITALS — BP 158/86 | HR 65 | Ht 67.5 in | Wt 218.8 lb

## 2019-02-05 DIAGNOSIS — I10 Essential (primary) hypertension: Secondary | ICD-10-CM | POA: Diagnosis not present

## 2019-02-05 DIAGNOSIS — E7849 Other hyperlipidemia: Secondary | ICD-10-CM | POA: Diagnosis not present

## 2019-02-05 DIAGNOSIS — I712 Thoracic aortic aneurysm, without rupture, unspecified: Secondary | ICD-10-CM

## 2019-02-05 DIAGNOSIS — Z7189 Other specified counseling: Secondary | ICD-10-CM

## 2019-02-05 DIAGNOSIS — I259 Chronic ischemic heart disease, unspecified: Secondary | ICD-10-CM

## 2019-02-05 NOTE — Patient Instructions (Addendum)
After Visit Summary:  We will be checking the following labs today - NONE  Fasting labs - lipids and LFTs in 6 months  Medication Instructions:    Continue with your current medicines.    If you need a refill on your cardiac medications before your next appointment, please call your pharmacy.     Testing/Procedures To Be Arranged:  Follow up CTA of the chest in one year  Follow-Up:   See me in one year    At [facility], you and your health needs are our priority.  As part of our continuing mission to provide you with exceptional heart care, we have created designated Provider Care Teams.  These Care Teams include your primary Cardiologist (physician) and Advanced Practice Providers (APPs -  Physician Assistants and Nurse Practitioners) who all work together to provide you with the care you need, when you need it.  Special Instructions:  . Stay safe, stay home, wash your hands for at least 20 seconds and wear a mask when out in public.  . It was good to talk with you today.  [name] Keep a check on your BP for me - send me a diary in the next few weeks - goal is less than 135/85 consistently                              Ascending Aortic Aneurysm/ Thoracic Aortic Aneurysm   Recent studies have raised concern that fluoroquinolone antibiotics could be associated with an increased risk of aortic aneurysm or aortic dissection. You should avoid use of Cipro and other associated antibiotics (flouroquinolone antibiotics )  It is  best to avoid activities that cause grunting or straining (medically referred to as a "valsalva maneuver"). This happens when a person bears down against a closed throat to increase the strength of arm or abdominal muscles. There's often a tendency to do this when lifting heavy weights, doing sit-ups, push-ups or chin-ups, etc., but it may be harmful.     An aneurysm is a bulge in an artery. It happens when blood pushes up against a weakened or damaged artery  wall. A thoracic aortic aneurysm is an aneurysm that occurs in the first part of the aorta, between the heart and the diaphragm. The aorta is the main artery of the body. It supplies blood from the heart to the rest of the body. Some aneurysms may not cause symptoms or problems. However, the major concern with a thoracic aortic aneurysm is that it can enlarge and burst (rupture), or blood can flow between the layers of the wall of the aorta through a tear (aorticdissection). Both of these conditions can cause bleeding inside the body and can be life-threatening if they are not diagnosed and treated right away. What are the causes? The exact cause of this condition is not known. What increases the risk? The following factors may make you more likely to develop this condition:  Being age 69 or older.  Having a hardening of the arteries caused by the buildup of fat and other substances in the lining of a blood vessel (arteriosclerosis).  Having inflammation of the walls of an artery (arteritis).  Having a genetic disease that weakens the body's connective tissue, such as Marfan syndrome.  Having an injury or trauma to the aorta.  Having an infection that is caused by bacteria, such as syphilis or staphylococcus, in the wall of the aorta (infectious aortitis).  Having  high blood pressure (hypertension).  Being male.  Being white (Caucasian).  Having high cholesterol.  Having a family history of aneurysms.  Using tobacco.  Having chronic obstructive pulmonary disease (COPD). What are the signs or symptoms? Symptoms of this condition vary depending on the size and rate of growth of the aneurysm. Most grow slowly and do not cause any symptoms. When symptoms do occur, they may include:  Pain in the chest, back, sides, or abdomen. The pain may vary in intensity. A sudden onset of severe pain may indicate that the aneurysm has ruptured.  Hoarseness.  Cough.  Shortness of  breath.  Swallowing problems.  Swelling in the face, arms, or legs.  Fever.  Unexplained weight loss. How is this diagnosed? This condition may be diagnosed with:  An ultrasound.  X-rays.  A CT scan.  An MRI.  Tests to check the arteries for damage or blockages (angiogram). Most unruptured thoracic aortic aneurysms cause no symptoms, so they are often found during exams for other conditions. How is this treated? Treatment for this condition depends on:  The size of the aneurysm.  How fast the aneurysm is growing.  Your age.  Risk factors for rupture. Aneurysms that are smaller than 2.2 inches (5.5 cm) may be managed by using medicines to control blood pressure, manage pain, or fight infection. You may need regular monitoring to see if the aneurysm is getting bigger. Your health care provider may recommend that you have an ultrasound every year or every 6 months. How often you need to have an ultrasound depends on the size of the aneurysm, how fast it is growing, and whether you have a family history of aneurysms. Surgical repair may be needed if your aneurysm is larger than 2.2 inches or if it is growing quickly. Follow these instructions at home: Eating and drinking   Eat a healthy diet. Your health care provider may recommend that you:  Lower your salt (sodium) intake. In some people, too much salt can raise blood pressure and increase the risk of thoracic aortic aneurysm.  Avoid foods that are high in saturated fat and cholesterol, such as red meat and dairy.  Eat a diet that is low in sugar.  Increase your fiber intake by including whole grains, vegetables, and fruits in your diet. Eating these foods may help to lower blood pressure.  Limit or avoid alcohol as recommended by your health care provider. Lifestyle   Follow instructions from your health care provider about healthy lifestyle habits. Your health care provider may recommend that you:  Do not use any  products that contain nicotine or tobacco, such as cigarettes and e-cigarettes. If you need help quitting, ask your health care provider.  Keep your blood pressure within normal limits. The target limit for most people is below 120/80. Check your blood pressure regularly. If it is high, ask your health care provider about ways that you can control it.  Keep your blood sugar (glucose) level and cholesterol levels within normal limits. Target limits for most people are:  Blood glucose level: Less than 100 mg/dL.  Total cholesterol level: Less than 200 mg/dL.  Maintain a healthy weight. Activity   Stay physically active and exercise regularly. Talk with your health care provider about how often you should exercise and ask which types of exercise are safe for you.  Avoid heavy lifting and activities that take a lot of effort (are strenuous). Ask your health care provider what activities are safe for you. General  instructions   Keep all follow-up visits as told by your health care provider. This is important.  Talk with your health care provider about regular screenings to see if the aneurysm is getting bigger.  Take over-the-counter and prescription medicines only as told by your health care provider. Contact a health care provider if:  You have discomfort in your upper back, neck, or abdomen.  You have trouble swallowing.  You have a cough or hoarseness.  You have a family history of aneurysms.  You have unexplained weight loss. Get help right away if:  You have sudden, severe pain in your upper back and abdomen. This pain may move into your chest and arms.  You have shortness of breath.  You have a fever. This information is not intended to replace advice given to you by your health care provider. Make sure you discuss any questions you have with your health care provider. Document Released: 02/08/2005 Document Revised: 11/21/2015 Document Reviewed: 11/21/2015 Elsevier  Interactive Patient Education  2017 [place].   Aortic Dissection An aortic dissection happens when there is a tear in the main blood vessel of the body (aorta). The aorta comes out of the heart, curves around, and then goes down the chest (thoracic aorta) and into the abdomen (abdominal aorta) to supply arteries with blood. The wall of the aorta has inner and outer layers. Aortic dissection occurs most often in the thoracic aorta. As the tear widens and blood flows through it, the aorta becomes "double-barreled." This means that one part of the aorta continues to carry blood to the body, but blood also flows into the tear, between the layers of the aorta. The torn part of the aorta fills with blood and swells up. This can reduce blood flow through the part of the aorta that is still supplying blood to the body. Aortic dissection is a medical emergency. What are the causes? An aortic dissection is commonly caused by weakening of the artery wall due to high blood pressure. Other causes may include:  An injury, such as from a car crash.  Birth defects that affect the heart (congenital heart defects).  Thickening of the artery walls. In some cases, the cause is not known. What increases the risk? The following factors may make you more likely to develop this condition:  Having certain medical conditions, such as:  High blood pressure (hypertension).  Hardening and narrowing of the arteries (atherosclerosis).  A genetic disorder that affects the connective tissue, such as Marfan syndrome or Ehlers-Danlos syndrome.  A condition that causes inflammation of blood vessels, such as giant cell arteritis.  Having a chest injury.  Having surgery on the aorta.  Being born with a congenital heart defect.  Being male.  Being older than age 52.  Using cocaine.  Smoking.  Lifting heavy weights or doing other types of high-intensity resistance training. What are the signs or  symptoms? Signs and symptoms of aortic dissection start suddenly. The most common symptoms are:  Severe chest pain that may feel like tearing, stabbing, or sharp pain.  Severe pain that spreads (radiates) to the back, neck, jaw, or abdomen. Other symptoms may include:  Trouble breathing.  Dizziness or fainting.  Sudden weakness on one side of the body.  Nausea or vomiting.  Trouble swallowing.  Coughing up blood.  Vomiting blood.  Clammy skin. How is this diagnosed? This condition may be diagnosed based on:  Your symptoms.  A physical exam. This may include:  Listening for abnormal  blood flow sounds (murmurs) in your chest or abdomen.  Checking your pulse in your arms and legs.  Checking your blood pressure to see whether it is low or whether there is a difference between the measurements in your right and left arm.  Electrocardiogram (ECG). This test measures the electrical activity in your heart.  Chest X-ray.  CT scan.  MRI.  Aortic angiogram. This test involves injecting dye to make it easier to see your blood vessels clearly.  Echocardiogram to study your heart using sound waves.  Blood tests. How is this treated? It is important to treat an aortic dissection as quickly as possible. Treatment may start as soon as your health care provider thinks that you have aortic dissection. Treatment depends on the location and severity of your dissection and your overall health. Treatment may include:  Medicines to lower your blood pressure.  Surgery to repair the dissected part of your aorta with artificial material (syntheticgraft).  A medical procedure to insert a stent-graft into the aorta (endovascular procedure). During this procedure, a long, thin tube (stent) is inserted into an artery near the groin (femoral artery) and moved up to the damaged part of the aorta. Then, the stent is opened to help improve blood flow and prevent future dissection. Follow these  instructions at home: Activity   Avoid activities that could injure your chest or your abdomen. Ask your health care provider what activities are safe for you.  After you have recovered, try to stay active. Ask your health care provider what activities are safe for you after recovery.  Do not lift anything that is heavier than 10 lb (4.5 kg) until your health care provider approves.  Do not drive or use heavy machinery while taking prescription pain medicine. Eating and drinking   Eat a heart-healthy diet, which includes lots of fresh fruits and vegetables, low-fat (lean) protein, and whole grains.  Check ingredients and nutrition facts on packaged foods and beverages, and avoid foods with high amounts of:  Salt (sodium).  Saturated fats (like red meat).  Trans fats (like fried food). General instructions   Take over-the-counter and prescription medicines only as told by your health care provider.  Work with your health care provider to manage your blood pressure.  Talk with your health care provider about how to manage stress.  Do not use any products that contain nicotine or tobacco, such as cigarettes and e-cigarettes. If you need help quitting, ask your health care provider.  Keep all follow-up visits as told by your health care provider. This is important. Get help right away if:  You develop any symptoms of aortic dissection after treatment, including severe pain in your chest, back, or abdomen.  You have a pain in your abdomen.  You have trouble breathing or you develop a cough.  You faint.  You develop a racing heartbeat. These symptoms may represent a serious problem that is an emergency. Do not wait to see if the symptoms will go away. Get medical help right away. Call your local emergency services (911 in the [place] Do not drive yourself to the hospital. Summary  An aortic dissection happens when there is a tear in the main blood vessel of the body (aorta). It  is a medical emergency.  The most common symptom is severe pain in the chest that spreads (radiates) to the back, neck, jaw, or abdomen.  It is important to treat an aortic dissection as quickly as possible. Treatment typically includes surgery and  medicines. This information is not intended to replace advice given to you by your health care provider. Make sure you discuss any questions you have with your health care provider. Document Released: 05/18/2007 Document Revised: 12/29/2015 Document Reviewed: 12/29/2015 Elsevier Interactive Patient Education  2017 [employer].     Call the [place] office at [phone] if you have any questions, problems or concerns.

## 2019-02-06 MED ORDER — ALLOPURINOL 100 MG PO TABS: 200.0000 mg | ORAL_TABLET | Freq: Every morning | ORAL | 0 refills | Status: DC

## 2019-03-06 ENCOUNTER — Encounter: Payer: Self-pay | Admitting: Family Medicine

## 2019-03-12 DIAGNOSIS — M79641 Pain in right hand: Secondary | ICD-10-CM | POA: Diagnosis not present

## 2019-03-12 DIAGNOSIS — M65341 Trigger finger, right ring finger: Secondary | ICD-10-CM | POA: Diagnosis not present

## 2019-03-12 DIAGNOSIS — M1811 Unilateral primary osteoarthritis of first carpometacarpal joint, right hand: Secondary | ICD-10-CM | POA: Diagnosis not present

## 2019-03-15 ENCOUNTER — Ambulatory Visit: Payer: Medicare Other | Attending: Internal Medicine

## 2019-03-15 DIAGNOSIS — Z23 Encounter for immunization: Secondary | ICD-10-CM

## 2019-03-15 NOTE — Progress Notes (Signed)
   Covid-19 Vaccination Clinic  Name:  Nathan Collins    MRN: [id number] DOB: 12-23-1950  03/15/2019  [name] was observed post Covid-19 immunization for 15 minutes without incidence. He was provided with Vaccine Information Sheet and instruction to access the V-Safe system.   [name] was instructed to call 911 with any severe reactions post vaccine: [name] Difficulty breathing  . Swelling of your face and throat  . A fast heartbeat  . A bad rash all over your body  . Dizziness and weakness    Immunizations Administered    Name Date Dose VIS Date Route   Pfizer COVID-19 Vaccine 03/15/2019  1:07 PM 0.3 mL 02/02/2019 Intramuscular   Manufacturer: [place]   Lot: [id number]   [place]: [id number]

## 2019-03-21 ENCOUNTER — Other Ambulatory Visit: Payer: Self-pay | Admitting: Nurse Practitioner

## 2019-04-05 ENCOUNTER — Ambulatory Visit: Payer: Medicare Other | Attending: Internal Medicine

## 2019-04-05 DIAGNOSIS — Z23 Encounter for immunization: Secondary | ICD-10-CM | POA: Insufficient documentation

## 2019-04-05 NOTE — Progress Notes (Signed)
   Covid-19 Vaccination Clinic  Name:  Nathan Collins    MRN: [id number] DOB: 1950-05-30  04/05/2019  [name] was observed post Covid-19 immunization for 15 minutes without incidence. He was provided with Vaccine Information Sheet and instruction to access the V-Safe system.   [name] was instructed to call 911 with any severe reactions post vaccine: [name] Difficulty breathing  . Swelling of your face and throat  . A fast heartbeat  . A bad rash all over your body  . Dizziness and weakness    Immunizations Administered    Name Date Dose VIS Date Route   Pfizer COVID-19 Vaccine 04/05/2019  1:27 PM 0.3 mL 02/02/2019 Intramuscular   Manufacturer: [place]   Lot: [id number]   [place]: [id number]

## 2019-04-09 DIAGNOSIS — M65341 Trigger finger, right ring finger: Secondary | ICD-10-CM | POA: Diagnosis not present

## 2019-04-09 DIAGNOSIS — M79641 Pain in right hand: Secondary | ICD-10-CM | POA: Diagnosis not present

## 2019-04-16 ENCOUNTER — Other Ambulatory Visit: Payer: Self-pay

## 2019-04-16 ENCOUNTER — Ambulatory Visit (INDEPENDENT_AMBULATORY_CARE_PROVIDER_SITE_OTHER): Payer: Medicare Other

## 2019-04-16 DIAGNOSIS — Z0000 Encounter for general adult medical examination without abnormal findings: Secondary | ICD-10-CM | POA: Diagnosis not present

## 2019-04-16 NOTE — Progress Notes (Signed)
This visit is being conducted via phone call due to the COVID-19 pandemic. This patient has given me verbal consent via phone to conduct this visit, patient states they are participating from their home address. Some vital signs may be absent or patient reported.   Patient identification: identified by name, DOB, and current address.  Location provider: [facility], Office Persons participating in the virtual visit: Denman George LPN, patient, and Dr. Dimas Chyle     Subjective:   HEZRON BRODEUR is a 69 y.o. male who presents for Medicare Annual/Subsequent preventive examination.  Review of Systems:   Cardiac Risk Factors include: advanced age (>68men, >49 women);male gender;hypertension;dyslipidemia    Objective:    Vitals: There were no vitals taken for this visit.  There is no height or weight on file to calculate BMI.  Advanced Directives 04/16/2019 03/16/2018 12/10/2011 12/07/2011  Does Patient Have a Medical Advance Directive? Yes Yes Patient has advance directive, copy not in chart Patient has advance directive, copy not in chart  Type of Advance Directive Living will;Healthcare Power of [place];Living will [place];Living will  Does patient want to make changes to medical advance directive? No - Patient declined - - -  Copy of [place] in Chart? No - copy requested - - -  Pre-existing out of facility DNR order (yellow form or pink MOST form) - - No No    Tobacco Social History   Tobacco Use  Smoking Status Never Smoker  Smokeless Tobacco Never Used     Counseling given: Not Answered   Clinical Intake:  Pre-visit preparation completed: Yes  Pain : No/denies pain  Diabetes: No  How often do you need to have someone help you when you read instructions, pamphlets, or other written materials from your doctor or pharmacy?: 1 - Never  Interpreter Needed?: No  Information entered by ::  Denman George LPN  Past Medical History:  Diagnosis Date  . Arthritis   . Cancer (HCC)    HX OF MALIGNANT MELANOMA SHOULDER   . Coronary artery disease    Medical thearpy, 02/20/18 CT coronary/FFR  . Heart murmur   . History of kidney stones     "A FEW YRS AGO"  . Hypertension   . Sleep apnea    CPAP- 4 GOES TO 12 OR 14   . Thoracic ascending aortic aneurysm (HCC)    4 cm 02/20/18   Past Surgical History:  Procedure Laterality Date  . APPENDECTOMY    . JOINT REPLACEMENT     RIGHT KNEE REPLACEMENT   . KNEE ARTHROSCOPY     LEFT   . LUMBAR LAMINECTOMY/DECOMPRESSION MICRODISCECTOMY Bilateral 03/23/2018   Procedure: Lumbar Three-Four Lumbar Four-Five Laminectomy/Foraminotomy with sublaminar decompression;  Surgeon: [name], MD;  Location: [facility];  Service: Neurosurgery;  Laterality: Bilateral;  Lumbar Three-Four Lumbar Four-Five Laminectomy/Foraminotomy with sublaminar decompression  . RIGHT KNEE SURGERY     . TOTAL KNEE ARTHROPLASTY  12/10/2011   Procedure: TOTAL KNEE ARTHROPLASTY;  Surgeon: [name], MD;  Location: [facility];  Service: Orthopedics;  Laterality: Left;  [name] VASECTOMY     Family History  Problem Relation Age of Onset  . Diabetes Father        deceased 06/05/08  . Prostate cancer Father        cabg   . Heart failure Father   . Bladder Cancer Mother        pacemaker 06/06/10  .  Peripheral Artery Disease Mother   . Arrhythmia Mother   . Heart failure Mother   . Hyperlipidemia Mother    Social History   Socioeconomic History  . Marital status: Married    Spouse name: Not on file  . Number of children: Not on file  . Years of education: Not on file  . Highest education level: Not on file  Occupational History    Comment: Sales Rep   Tobacco Use  . Smoking status: Never Smoker  . Smokeless tobacco: Never Used  Substance and Sexual Activity  . Alcohol use: Not Currently  . Drug use: No  . Sexual activity: Yes    Partners: Female  Other Topics Concern   . Not on file  Social History Narrative  . Not on file   Social Determinants of Health   Financial Resource Strain:   . Difficulty of Paying Living Expenses: Not on file  Food Insecurity:   . Worried About Charity fundraiser in the Last Year: Not on file  . Ran Out of Food in the Last Year: Not on file  Transportation Needs:   . Lack of Transportation (Medical): Not on file  . Lack of Transportation (Non-Medical): Not on file  Physical Activity:   . Days of Exercise per Week: Not on file  . Minutes of Exercise per Session: Not on file  Stress:   . Feeling of Stress : Not on file  Social Connections:   . Frequency of Communication with Friends and Family: Not on file  . Frequency of Social Gatherings with Friends and Family: Not on file  . Attends Religious Services: Not on file  . Active Member of Clubs or Organizations: Not on file  . Attends Archivist Meetings: Not on file  . Marital Status: Not on file    Outpatient Encounter Medications as of 04/16/2019  Medication Sig  . allopurinol (ZYLOPRIM) 100 MG tablet Take 2 tablets (200 mg total) by mouth every morning.  [name] amoxicillin (AMOXIL) 500 MG capsule amoxicillin 500 mg capsule  TAKE 4 CAPSULES BY MOUTH 1 HOUR PRIOR TO DENTAL APT  . aspirin EC 81 MG tablet Take 81 mg by mouth daily.  [name] atorvastatin (LIPITOR) 20 MG tablet TAKE 1 TABLET BY MOUTH EVERY DAY  . Coenzyme Q10 (CO Q 10 PO) Take 200 mg by mouth daily.   [name] COLCRYS 0.6 MG tablet TAKE 1 TABLET (0.6 MG TOTAL) BY MOUTH DAILY AS NEEDED (GOUT).  [name] losartan (COZAAR) 100 MG tablet Take 1 tablet (100 mg total) by mouth every morning.  . Multiple Vitamin (MULTIVITAMIN) tablet Take 1 tablet by mouth daily.  . sildenafil (REVATIO) 20 MG tablet Take 100 mg by mouth daily as needed (ED).   No facility-administered encounter medications on file as of 04/16/2019.    Activities of Daily Living In your present state of health, do you have any difficulty performing the  following activities: 04/16/2019  Hearing? N  Vision? N  Difficulty concentrating or making decisions? N  Walking or climbing stairs? N  Dressing or bathing? N  Doing errands, shopping? N  Preparing Food and eating ? N  Using the Toilet? N  In the past six months, have you accidently leaked urine? N  Do you have problems with loss of bowel control? N  Managing your Medications? N  Managing your Finances? N  Housekeeping or managing your Housekeeping? N  Some recent data might be hidden    Patient Care Team: Jerline Pain,  Algis Greenhouse, MD as PCP - General (Family Medicine) Roseanne Kaufman, MD as Consulting Physician (Orthopedic Surgery) Burtis Junes, NP as Nurse Practitioner (Cardiology) Hortencia Pilar, MD as Consulting Physician (Ophthalmology) [name], MD as Consulting Physician (Neurosurgery) Marius Ditch, MD as Consulting Physician (Sleep Medicine)   Assessment:   This is a routine wellness examination for [place].  Exercise Activities and Dietary recommendations Current Exercise Habits: The patient does not participate in regular exercise at present  Goals   None     Fall Risk Fall Risk  04/16/2019 10/11/2017  Falls in the past year? 0 No  Number falls in past yr: 0 -  Injury with Fall? 0 -  Follow up Falls evaluation completed;Education provided;Falls prevention discussed -   Is the patient's home free of loose throw rugs in walkways, pet beds, electrical cords, etc?   yes      Grab bars in the bathroom? yes      Handrails on the stairs?   yes      Adequate lighting?   yes   Depression Screen PHQ 2/9 Scores 04/16/2019 12/14/2018 10/11/2017  PHQ - 2 Score 0 0 0    Cognitive Function- no cognitive concerns at this time      6CIT Screen 04/16/2019  What Year? 0 points  What month? 0 points  What time? 0 points  Count back from 20 0 points  Months in reverse 0 points  Repeat phrase 0 points  Total Score 0    Immunization History  Administered  Date(s) Administered  . Fluad Quad(high Dose 65+) 12/12/2018  . Influenza, High Dose Seasonal PF 02/27/2018  . PFIZER SARS-COV-2 Vaccination 03/15/2019, 04/05/2019  . Pneumococcal Conjugate-13 11/10/2016  . Pneumococcal Polysaccharide-23 11/10/2015  . Tdap 04/26/2016    Qualifies for Shingles Vaccine? Discussed and patient will check with pharmacy for coverage.  Patient education handout provided   Screening Tests Health Maintenance  Topic Date Due  . Hepatitis C Screening  04-10-1950  . TETANUS/TDAP  04/27/2026  . COLONOSCOPY  03/05/2027  . INFLUENZA VACCINE  Completed  . PNA vac Low Risk Adult  Completed   Cancer Screenings: Lung: Low Dose CT Chest recommended if Age 4-80 years, 30 pack-year currently smoking OR have quit w/in 15years. Patient does not qualify. Colorectal: colonoscopy 03/04/17 (repeat in 10 years)      Plan:    I have personally reviewed and addressed the Medicare Annual Wellness questionnaire and have noted the following in the patient's chart:  A. Medical and social history B. Use of alcohol, tobacco or illicit drugs  C. Current medications and supplements D. Functional ability and status E.  Nutritional status F.  Physical activity G. Advance directives H. List of other physicians I.  Hospitalizations, surgeries, and ER visits in previous 12 months J.  [place] such as hearing and vision if needed, cognitive and depression L. Referrals, records requested, and appointments- none   In addition, I have reviewed and discussed with patient certain preventive protocols, quality metrics, and best practice recommendations. A written personalized care plan for preventive services as well as general preventive health recommendations were provided to patient.   Signed,  Denman George, LPN  Nurse Health Advisor   Nurse Notes: Patient education handouts provided on allergic rhinitis and cpap machine maintenance

## 2019-04-16 NOTE — Patient Instructions (Signed)
Mr. Nathan Collins , Thank you for taking time to come for your Medicare Wellness Visit. I appreciate your ongoing commitment to your health goals. Please review the following plan we discussed and let me know if I can assist you in the future.   Screening recommendations/referrals: Colorectal Screening: up to date; colonoscopy 03/04/17 (repeat 2029)  Vision and Dental Exams: Recommended annual ophthalmology exams for early detection of glaucoma and other disorders of the eye Recommended annual dental exams for proper oral hygiene  Vaccinations: Influenza vaccine: completed 12/12/18 Pneumococcal vaccine: up to date; last 11/10/16 Tdap vaccine: up to date; last 04/26/16  Shingles vaccine: Please call your insurance company to determine your out of pocket expense for the Shingrix vaccine. You may receive this vaccine at your local pharmacy. (see handout)   Advanced directives: Please bring a copy of your POA (Power of Attorney) and/or Living Will to your next appointment.  Goals: Recommend to drink at least 6-8 8oz glasses of water per day and consume a balanced diet rich in fresh fruits and vegetables.   Next appointment: Please schedule your Annual Wellness Visit with your Nurse Health Advisor in one year.  Preventive Care 30 Years and Older, Male Preventive care refers to lifestyle choices and visits with your health care provider that can promote health and wellness. What does preventive care include?  A yearly physical exam. This is also called an annual well check.  Dental exams once or twice a year.  Routine eye exams. Ask your health care provider how often you should have your eyes checked.  Personal lifestyle choices, including:  Daily care of your teeth and gums.  Regular physical activity.  Eating a healthy diet.  Avoiding tobacco and drug use.  Limiting alcohol use.  Practicing safe sex.  Taking low doses of aspirin every day if recommended by your health care  provider..  Taking vitamin and mineral supplements as recommended by your health care provider. What happens during an annual well check? The services and screenings done by your health care provider during your annual well check will depend on your age, overall health, lifestyle risk factors, and family history of disease. Counseling  Your health care provider may ask you questions about your:  Alcohol use.  Tobacco use.  Drug use.  Emotional well-being.  Home and relationship well-being.  Sexual activity.  Eating habits.  History of falls.  Memory and ability to understand (cognition).  Work and work Statistician. Screening  You may have the following tests or measurements:  Height, weight, and BMI.  Blood pressure.  Lipid and cholesterol levels. These may be checked every 5 years, or more frequently if you are over 42 years old.  Skin check.  Lung cancer screening. You may have this screening every year starting at age 35 if you have a 30-pack-year history of smoking and currently smoke or have quit within the past 15 years.  Fecal occult blood test (FOBT) of the stool. You may have this test every year starting at age 2.  Flexible sigmoidoscopy or colonoscopy. You may have a sigmoidoscopy every 5 years or a colonoscopy every 10 years starting at age 59.  Prostate cancer screening. Recommendations will vary depending on your family history and other risks.  Hepatitis C blood test.  Hepatitis B blood test.  Sexually transmitted disease (STD) testing.  Diabetes screening. This is done by checking your blood sugar (glucose) after you have not eaten for a while (fasting). You may have this done every 1-3  years.  Abdominal aortic aneurysm (AAA) screening. You may need this if you are a current or former smoker.  Osteoporosis. You may be screened starting at age 30 if you are at high risk. Talk with your health care provider about your test results, treatment  options, and if necessary, the need for more tests. Vaccines  Your health care provider may recommend certain vaccines, such as:  Influenza vaccine. This is recommended every year.  Tetanus, diphtheria, and acellular pertussis (Tdap, Td) vaccine. You may need a Td booster every 10 years.  Zoster vaccine. You may need this after age 32.  Pneumococcal 13-valent conjugate (PCV13) vaccine. One dose is recommended after age 17.  Pneumococcal polysaccharide (PPSV23) vaccine. One dose is recommended after age 52. Talk to your health care provider about which screenings and vaccines you need and how often you need them. This information is not intended to replace advice given to you by your health care provider. Make sure you discuss any questions you have with your health care provider. Document Released: 03/07/2015 Document Revised: 10/29/2015 Document Reviewed: 12/10/2014 Elsevier Interactive Patient Education  2017 [employer] Prevention in the Home Falls can cause injuries. They can happen to people of all ages. There are many things you can do to make your home safe and to help prevent falls. What can I do on the outside of my home?  Regularly fix the edges of walkways and driveways and fix any cracks.  Remove anything that might make you trip as you walk through a door, such as a raised step or threshold.  Trim any bushes or trees on the path to your home.  Use bright outdoor lighting.  Clear any walking paths of anything that might make someone trip, such as rocks or tools.  Regularly check to see if handrails are loose or broken. Make sure that both sides of any steps have handrails.  Any raised decks and porches should have guardrails on the edges.  Have any leaves, snow, or ice cleared regularly.  Use sand or salt on walking paths during winter.  Clean up any spills in your garage right away. This includes oil or grease spills. What can I do in the  bathroom?  Use night lights.  Install grab bars by the toilet and in the tub and shower. Do not use towel bars as grab bars.  Use non-skid mats or decals in the tub or shower.  If you need to sit down in the shower, use a plastic, non-slip stool.  Keep the floor dry. Clean up any water that spills on the floor as soon as it happens.  Remove soap buildup in the tub or shower regularly.  Attach bath mats securely with double-sided non-slip rug tape.  Do not have throw rugs and other things on the floor that can make you trip. What can I do in the bedroom?  Use night lights.  Make sure that you have a light by your bed that is easy to reach.  Do not use any sheets or blankets that are too big for your bed. They should not hang down onto the floor.  Have a firm chair that has side arms. You can use this for support while you get dressed.  Do not have throw rugs and other things on the floor that can make you trip. What can I do in the kitchen?  Clean up any spills right away.  Avoid walking on wet floors.  Keep items that  you use a lot in easy-to-reach places.  If you need to reach something above you, use a strong step stool that has a grab bar.  Keep electrical cords out of the way.  Do not use floor polish or wax that makes floors slippery. If you must use wax, use non-skid floor wax.  Do not have throw rugs and other things on the floor that can make you trip. What can I do with my stairs?  Do not leave any items on the stairs.  Make sure that there are handrails on both sides of the stairs and use them. Fix handrails that are broken or loose. Make sure that handrails are as long as the stairways.  Check any carpeting to make sure that it is firmly attached to the stairs. Fix any carpet that is loose or worn.  Avoid having throw rugs at the top or bottom of the stairs. If you do have throw rugs, attach them to the floor with carpet tape.  Make sure that you have a  light switch at the top of the stairs and the bottom of the stairs. If you do not have them, ask someone to add them for you. What else can I do to help prevent falls?  Wear shoes that:  Do not have high heels.  Have rubber bottoms.  Are comfortable and fit you well.  Are closed at the toe. Do not wear sandals.  If you use a stepladder:  Make sure that it is fully opened. Do not climb a closed stepladder.  Make sure that both sides of the stepladder are locked into place.  Ask someone to hold it for you, if possible.  Clearly mark and make sure that you can see:  Any grab bars or handrails.  First and last steps.  Where the edge of each step is.  Use tools that help you move around (mobility aids) if they are needed. These include:  Canes.  Walkers.  Scooters.  Crutches.  Turn on the lights when you go into a dark area. Replace any light bulbs as soon as they burn out.  Set up your furniture so you have a clear path. Avoid moving your furniture around.  If any of your floors are uneven, fix them.  If there are any pets around you, be aware of where they are.  Review your medicines with your doctor. Some medicines can make you feel dizzy. This can increase your chance of falling. Ask your doctor what other things that you can do to help prevent falls. This information is not intended to replace advice given to you by your health care provider. Make sure you discuss any questions you have with your health care provider. Document Released: 12/05/2008 Document Revised: 07/17/2015 Document Reviewed: 03/15/2014 Elsevier Interactive Patient Education  2017 [employer].

## 2019-05-13 ENCOUNTER — Other Ambulatory Visit: Payer: Self-pay | Admitting: Family Medicine

## 2019-05-21 ENCOUNTER — Other Ambulatory Visit: Payer: Self-pay | Admitting: Family Medicine

## 2019-07-30 ENCOUNTER — Other Ambulatory Visit: Payer: Self-pay

## 2019-07-30 ENCOUNTER — Other Ambulatory Visit: Payer: Medicare Other

## 2019-07-30 DIAGNOSIS — I10 Essential (primary) hypertension: Secondary | ICD-10-CM

## 2019-07-30 DIAGNOSIS — I259 Chronic ischemic heart disease, unspecified: Secondary | ICD-10-CM | POA: Diagnosis not present

## 2019-07-30 DIAGNOSIS — Z7189 Other specified counseling: Secondary | ICD-10-CM

## 2019-07-30 LAB — HEPATIC FUNCTION PANEL
ALT: 28 IU/L (ref 0–44)
AST: 23 IU/L (ref 0–40)
Albumin: 4.5 g/dL (ref 3.8–4.8)
Alkaline Phosphatase: 93 IU/L (ref 48–121)
Bilirubin Total: 0.4 mg/dL (ref 0.0–1.2)
Bilirubin, Direct: 0.13 mg/dL (ref 0.00–0.40)
Total Protein: 6.3 g/dL (ref 6.0–8.5)

## 2019-07-30 LAB — LIPID PANEL
Chol/HDL Ratio: 3.8 ratio (ref 0.0–5.0)
Cholesterol, Total: 151 mg/dL (ref 100–199)
HDL: 40 mg/dL (ref >39)
LDL Chol Calc (NIH): 91 mg/dL (ref 0–99)
Triglycerides: 111 mg/dL (ref 0–149)
VLDL Cholesterol Cal: 20 mg/dL (ref 5–40)

## 2019-08-07 ENCOUNTER — Other Ambulatory Visit: Payer: Self-pay | Admitting: *Deleted

## 2019-08-07 DIAGNOSIS — E7849 Other hyperlipidemia: Secondary | ICD-10-CM

## 2019-08-07 MED ORDER — EZETIMIBE 10 MG PO TABS: 10.0000 mg | ORAL_TABLET | Freq: Every day | ORAL | 11 refills | Status: DC

## 2019-08-12 ENCOUNTER — Other Ambulatory Visit: Payer: Self-pay | Admitting: Family Medicine

## 2019-08-17 ENCOUNTER — Other Ambulatory Visit: Payer: Self-pay | Admitting: Family Medicine

## 2019-08-20 ENCOUNTER — Other Ambulatory Visit: Payer: Self-pay | Admitting: *Deleted

## 2019-08-20 ENCOUNTER — Telehealth: Payer: Self-pay | Admitting: *Deleted

## 2019-08-20 MED ORDER — VALSARTAN 160 MG PO TABS: 160.0000 mg | ORAL_TABLET | Freq: Every day | ORAL | 3 refills | Status: DC

## 2019-08-20 NOTE — Telephone Encounter (Signed)
Let's change over to Valsartan 160 mg a day - this is in the same class but a better Bp drug.   Needs to continue to monitor - goal is less than 130/80 consistently.   Cecille Rubin

## 2019-08-20 NOTE — Telephone Encounter (Signed)
Pt calling in today due to new recommendation's from [place].  D/C losartan and start valsartan one table by mouth (160 mg) daily, sent to requested pharmacy # [id number], medication list updated.

## 2019-08-20 NOTE — Telephone Encounter (Signed)
lvm pt needs to call office for update on medication change.

## 2019-08-21 ENCOUNTER — Other Ambulatory Visit: Payer: Self-pay | Admitting: *Deleted

## 2019-08-21 MED ORDER — VALSARTAN 160 MG PO TABS: 160.0000 mg | ORAL_TABLET | Freq: Every day | ORAL | 3 refills | Status: DC

## 2019-09-03 ENCOUNTER — Other Ambulatory Visit: Payer: Self-pay | Admitting: *Deleted

## 2019-09-03 DIAGNOSIS — I10 Essential (primary) hypertension: Secondary | ICD-10-CM

## 2019-09-03 DIAGNOSIS — I259 Chronic ischemic heart disease, unspecified: Secondary | ICD-10-CM

## 2019-09-11 IMAGING — MR MR LUMBAR SPINE W/O CM
4 of 5 series · 27 of 48 positions shown · non-contrast
Comparison: None.

CLINICAL DATA: Low back, right hip and leg pain for 2 months. No
known injury.

EXAM:
MRI LUMBAR SPINE WITHOUT CONTRAST
TECHNIQUE: Multiplanar, multisequence MR imaging of the lumbar spine was
performed. No intravenous contrast was administered.

[Series 2: T2 · sagittal · 4.0mm · 1.09mm/px · 7 of 16 slices shown (1 of 2)]
[im 1/16]
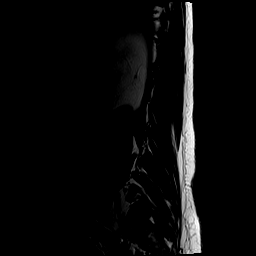
[im 3/16]
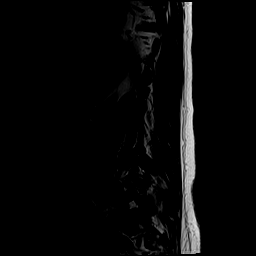
[im 6/16]
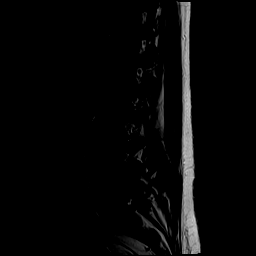
[im 8/16]
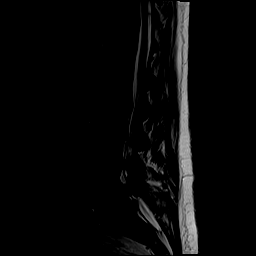
[im 11/16]
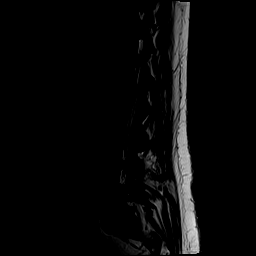
[im 13/16]
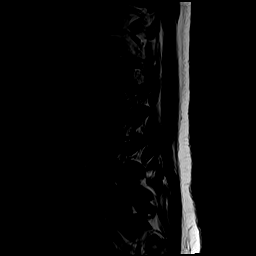
[im 16/16]
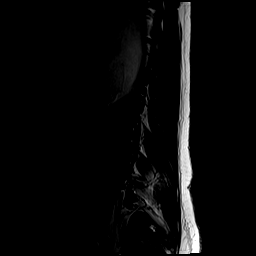

[Series 4: T1 · sagittal · 4.0mm · 1.09mm/px · 6 of 16 slices shown (1 of 2)]
[im 1/16]
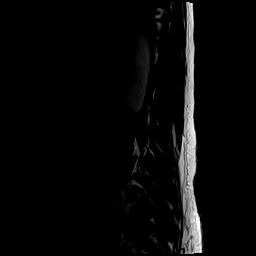
[im 4/16]
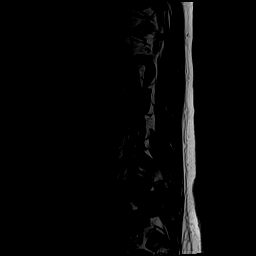
[im 7/16]
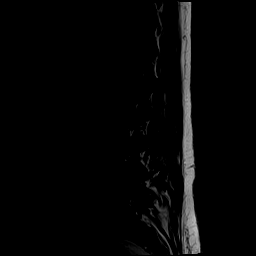
[im 10/16]
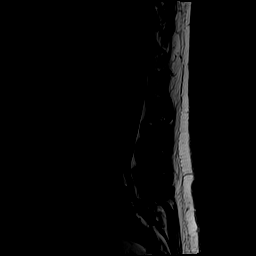
[im 13/16]
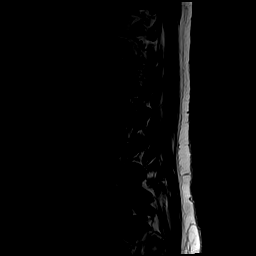
[im 16/16]
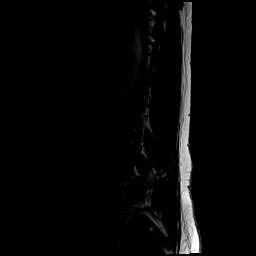

[Series 5: T2 · axial · 4.0mm · 0.39mm/px · z∈[-67,+127]mm · 8 of 36 slices shown (2 of 2)]
[im 1/36]
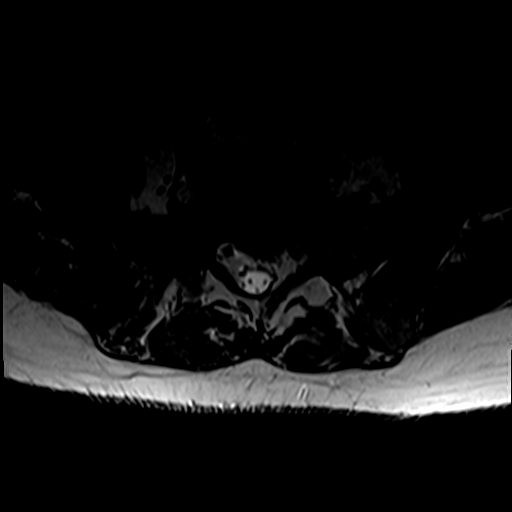
[im 6/36]
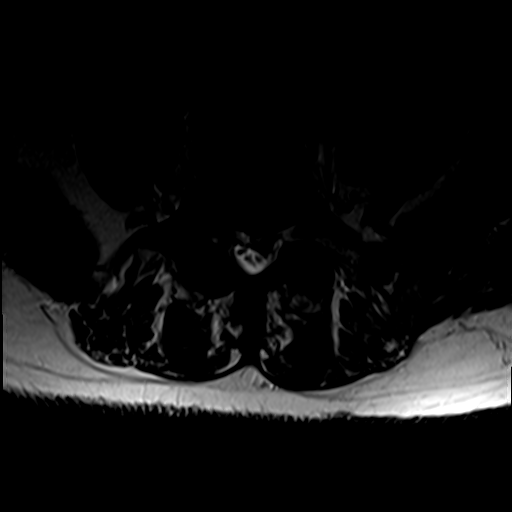
[im 11/36]
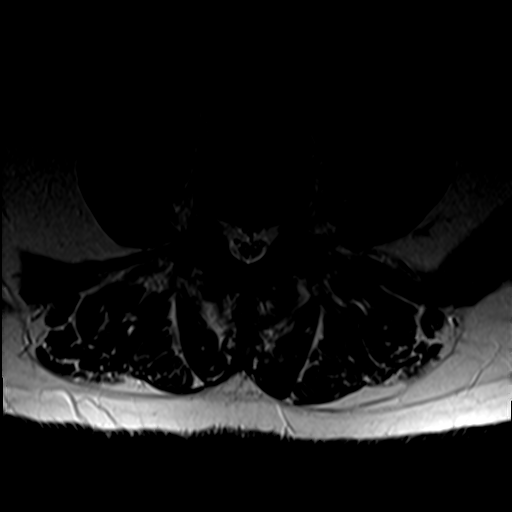
[im 17/36]
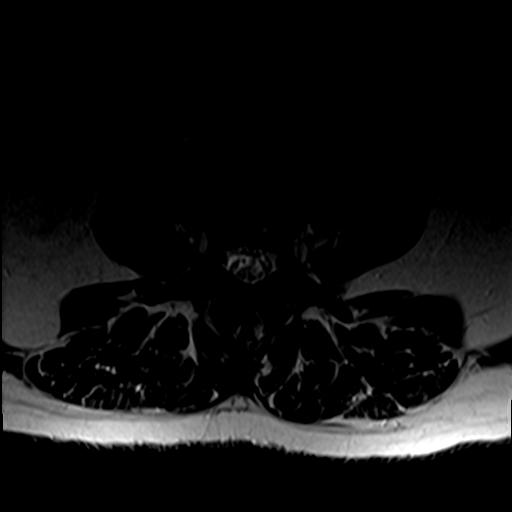
[im 19/36]
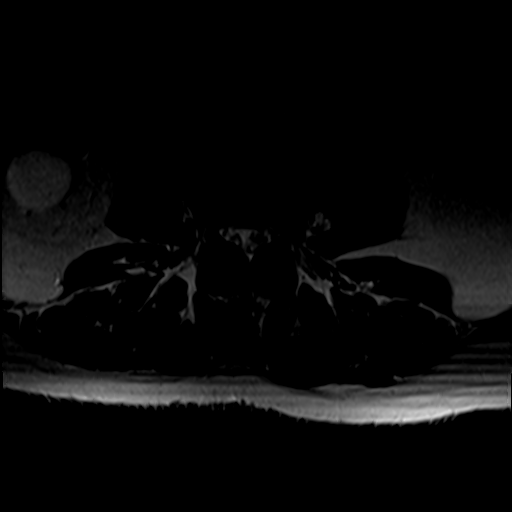
[im 25/36]
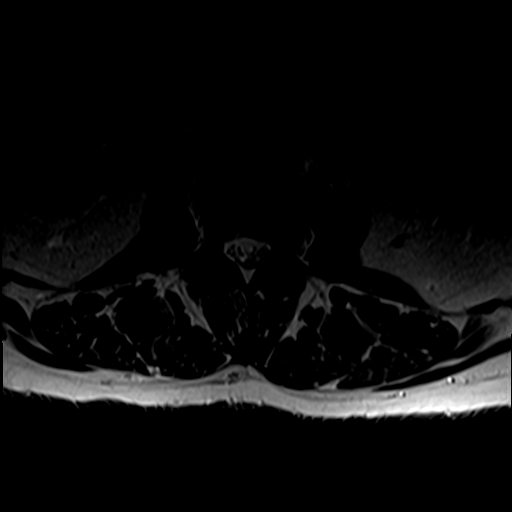
[im 30/36]
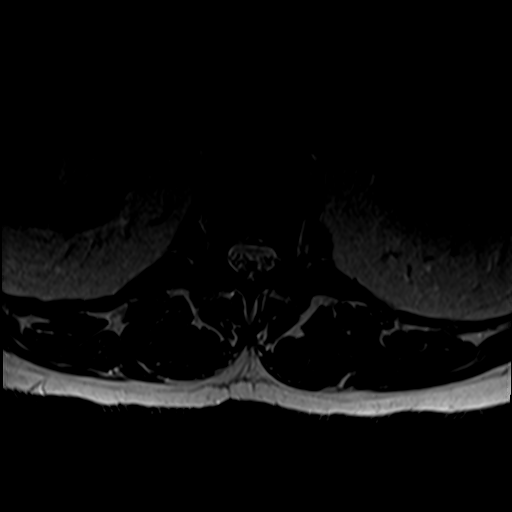
[im 36/36]
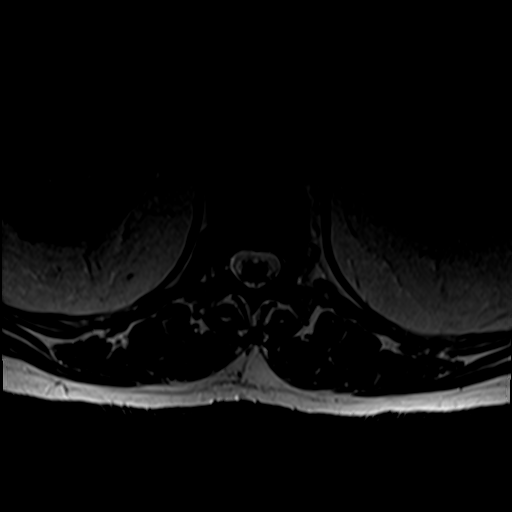

[Series 6: T1 · axial · 4.0mm · 0.39mm/px · z∈[-67,+99]mm · 6 of 36 slices shown (2 of 2)]
[im 1/36]
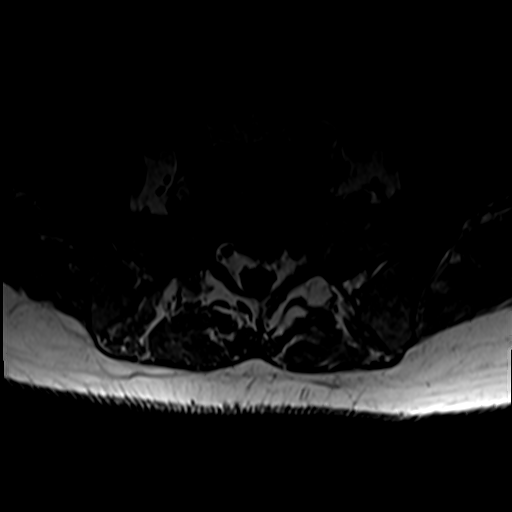
[im 6/36]
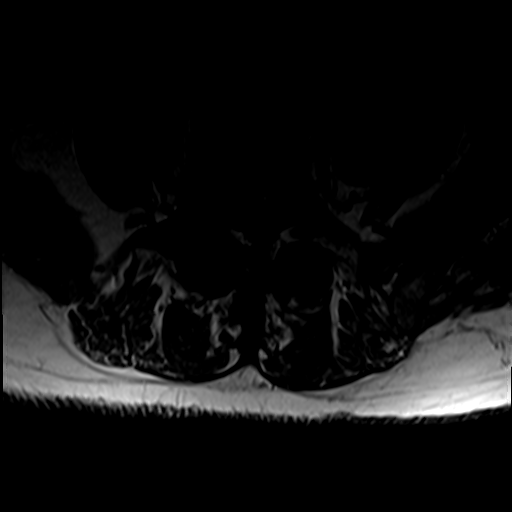
[im 11/36]
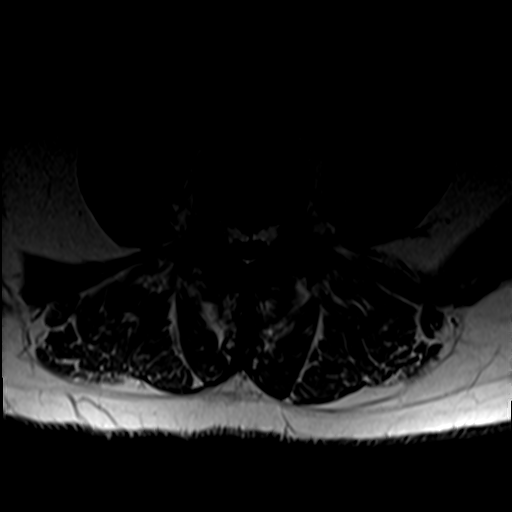
[im 17/36]
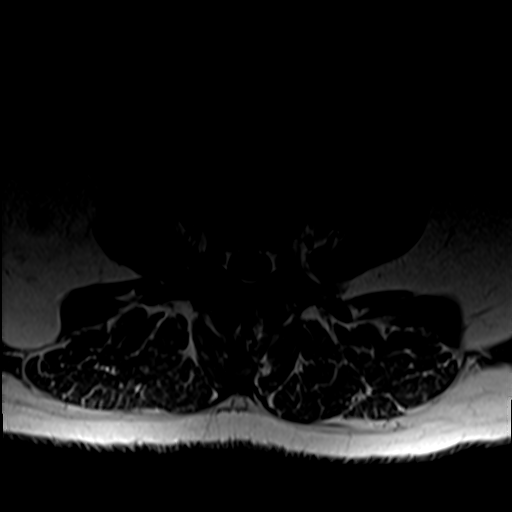
[im 19/36]
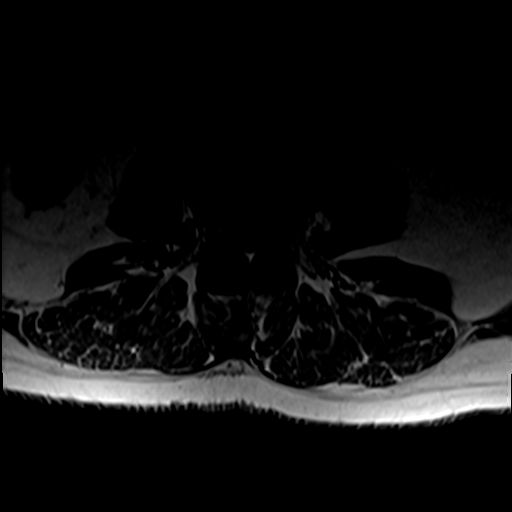
[im 30/36]
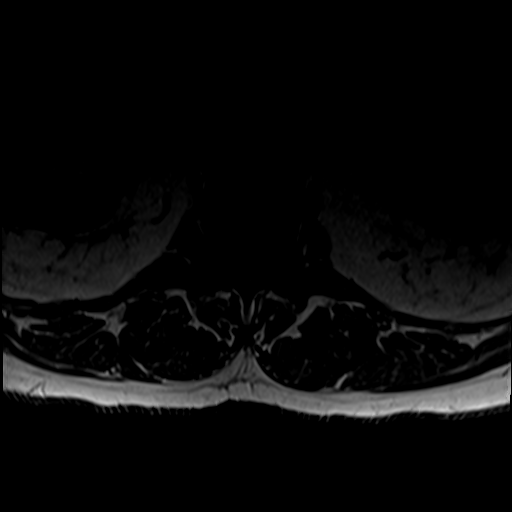

[27 of 48 positions shown; findings below may reference images not displayed]

FINDINGS: Segmentation:  Standard.

Alignment: 0.3 cm retrolisthesis L3 on L4 noted. Otherwise
maintained.

Vertebrae: No fracture or worrisome lesion. Hemangioma in L4 noted.
Degenerative endplate signal change appears worst at L5-S1. The
patient has a congenitally narrow central canal in the mid and lower
lumbar segments.

Conus medullaris and cauda equina: Conus extends to the L1 level.
Conus and cauda equina appear normal.

Paraspinal and other soft tissues: Negative.

Disc levels:

T10-11 and T11-12 are imaged in the sagittal plane only. There is a
minimal central protrusion at T10-11. The central canal and foramina
appear open at both levels.

T12-L1: Mild facet degenerative change.  Otherwise negative.

L1-2: Small down turning left lateral recess protrusion causes mild
narrowing in the lateral recess but no nerve root compression is
seen. The foramina are open.

L2-3: Shallow disc bulge causes mild central canal narrowing. The
foramina are open.

L3-4: Broad-based central protrusion, ligamentum flavum thickening
and moderate facet degenerative disease are identified. There is
moderately severe to severe central canal stenosis and marked
narrowing in both lateral recesses which could impact either L4
root. The foramina are open.

L4-5: Moderate to moderately severe facet degenerative disease is
worse on the right. There is a synovial cyst off the anterior,
medial aspect of the right facet which measures 0.9 cm transverse x
0.6 cm AP x 1.2 cm craniocaudal. Broad-based central protrusion and
ligamentum flavum thickening are seen. There is moderately severe to
severe central canal stenosis and narrowing in the subarticular
recesses, severe on the right. Either descending L5 root could be
affected. Moderate to moderately severe bilateral foraminal
narrowing is worse on the left.

L5-S1: Moderate facet arthropathy is identified. There is a left
paracentral and lateral recess disc protrusion with some cephalad
extension. The disc indents the ventral thecal sac and impinges on
the left S1. Mild to moderate foraminal narrowing at this level is
worse on the left.
IMPRESSION: Congenitally narrow central canal in the mid and lower lumbar
segments.

Moderately severe to severe central canal stenosis at L3-4 with
marked narrowing in both lateral recesses and encroachment on both
descending L4 roots.

Moderately severe to severe central canal stenosis and right worse
than left subarticular recess narrowing at L4-5. Synovial cyst off
the right facet joint contributes to severe narrowing in the right
subarticular recess. There is encroachment on both descending L5
roots.

Left paracentral and lateral recess disc protrusion at L5-S1
impinges on the descending left S1 root.

## 2019-09-24 ENCOUNTER — Other Ambulatory Visit: Payer: Medicare Other

## 2019-09-24 ENCOUNTER — Other Ambulatory Visit: Payer: Self-pay

## 2019-09-24 DIAGNOSIS — E7849 Other hyperlipidemia: Secondary | ICD-10-CM

## 2019-09-24 DIAGNOSIS — I259 Chronic ischemic heart disease, unspecified: Secondary | ICD-10-CM | POA: Diagnosis not present

## 2019-09-24 DIAGNOSIS — I10 Essential (primary) hypertension: Secondary | ICD-10-CM | POA: Diagnosis not present

## 2019-09-24 LAB — BASIC METABOLIC PANEL
BUN/Creatinine Ratio: 16 (ref 10–24)
BUN: 20 mg/dL (ref 8–27)
CO2: 21 mmol/L (ref 20–29)
Calcium: 9.2 mg/dL (ref 8.6–10.2)
Chloride: 106 mmol/L (ref 96–106)
Creatinine, Ser: 1.23 mg/dL (ref 0.76–1.27)
GFR calc Af Amer: 69 mL/min/{1.73_m2} (ref >59)
GFR calc non Af Amer: 60 mL/min/{1.73_m2} (ref >59)
Glucose: 110 mg/dL — ABNORMAL HIGH (ref 65–99)
Potassium: 4.6 mmol/L (ref 3.5–5.2)
Sodium: 142 mmol/L (ref 134–144)

## 2019-09-24 LAB — HEPATIC FUNCTION PANEL
ALT: 50 IU/L — ABNORMAL HIGH (ref 0–44)
AST: 37 IU/L (ref 0–40)
Albumin: 4.5 g/dL (ref 3.8–4.8)
Alkaline Phosphatase: 91 IU/L (ref 48–121)
Bilirubin Total: 0.5 mg/dL (ref 0.0–1.2)
Bilirubin, Direct: 0.13 mg/dL (ref 0.00–0.40)
Total Protein: 6.6 g/dL (ref 6.0–8.5)

## 2019-09-24 LAB — LIPID PANEL
Chol/HDL Ratio: 3.3 ratio (ref 0.0–5.0)
Cholesterol, Total: 130 mg/dL (ref 100–199)
HDL: 40 mg/dL (ref >39)
LDL Chol Calc (NIH): 70 mg/dL (ref 0–99)
Triglycerides: 107 mg/dL (ref 0–149)
VLDL Cholesterol Cal: 20 mg/dL (ref 5–40)

## 2019-10-09 ENCOUNTER — Other Ambulatory Visit: Payer: Self-pay | Admitting: *Deleted

## 2019-10-09 DIAGNOSIS — R7401 Elevation of levels of liver transaminase levels: Secondary | ICD-10-CM

## 2019-10-09 NOTE — Progress Notes (Signed)
error 

## 2019-11-06 ENCOUNTER — Other Ambulatory Visit: Payer: Self-pay | Admitting: Family Medicine

## 2019-11-12 DIAGNOSIS — G4733 Obstructive sleep apnea (adult) (pediatric): Secondary | ICD-10-CM | POA: Diagnosis not present

## 2019-11-16 DIAGNOSIS — H35372 Puckering of macula, left eye: Secondary | ICD-10-CM | POA: Diagnosis not present

## 2019-11-16 DIAGNOSIS — H43813 Vitreous degeneration, bilateral: Secondary | ICD-10-CM | POA: Diagnosis not present

## 2019-11-16 DIAGNOSIS — H25013 Cortical age-related cataract, bilateral: Secondary | ICD-10-CM | POA: Diagnosis not present

## 2019-11-16 DIAGNOSIS — H2513 Age-related nuclear cataract, bilateral: Secondary | ICD-10-CM | POA: Diagnosis not present

## 2019-11-17 ENCOUNTER — Other Ambulatory Visit: Payer: Self-pay | Admitting: Family Medicine

## 2019-11-17 MED ORDER — ALLOPURINOL 100 MG PO TABS: 200.0000 mg | ORAL_TABLET | Freq: Every morning | ORAL | 0 refills | Status: DC

## 2019-11-29 DIAGNOSIS — Z23 Encounter for immunization: Secondary | ICD-10-CM | POA: Diagnosis not present

## 2019-12-03 ENCOUNTER — Other Ambulatory Visit: Payer: Self-pay

## 2019-12-03 ENCOUNTER — Other Ambulatory Visit: Payer: Medicare Other

## 2019-12-03 DIAGNOSIS — R7401 Elevation of levels of liver transaminase levels: Secondary | ICD-10-CM | POA: Diagnosis not present

## 2019-12-03 LAB — HEPATIC FUNCTION PANEL
ALT: 31 IU/L (ref 0–44)
AST: 26 IU/L (ref 0–40)
Albumin: 4.4 g/dL (ref 3.8–4.8)
Alkaline Phosphatase: 99 IU/L (ref 44–121)
Bilirubin Total: 0.5 mg/dL (ref 0.0–1.2)
Bilirubin, Direct: 0.15 mg/dL (ref 0.00–0.40)
Total Protein: 6.7 g/dL (ref 6.0–8.5)

## 2020-01-16 DIAGNOSIS — Z23 Encounter for immunization: Secondary | ICD-10-CM | POA: Diagnosis not present

## 2020-01-24 ENCOUNTER — Other Ambulatory Visit: Payer: Self-pay | Admitting: Nurse Practitioner

## 2020-02-04 ENCOUNTER — Ambulatory Visit: Payer: Medicare Other | Admitting: Nurse Practitioner

## 2020-02-27 NOTE — Progress Notes (Signed)
CARDIOLOGY OFFICE NOTE  Date:  03/04/2020    Nathan Collins Date of Birth: [birth year] Medical Record [id number]  PCP:  Vivi Barrack, MD  Cardiologist:  Servando Snare     Chief Complaint  Patient presents with  . Follow-up    History of Present Illness: Nathan Collins is a 70 y.o. male who presents today for a follow up visit. He elected to be followed by me. I see his wife - [name].    He has a history of HTN, gout, HLD, OSA and history of melanoma.    I saw him as a new patient back in November of 2019 - his wife had wanted him "checked out" due to multiple risk factors. He has +FH for CAD. Both parents developed heart disease in their 71's - dad had CHF/MI/multiple stents. Mom with PAD/CHF - she smoked. He has never smoked. He is on CPAP for OSA. We got a coronary CT - he is managed medically for CAD (have discussed with Dr. Irish Lack in the past).    When seen in January 2020 - felt to be doing ok. He was cleared for back surgery after discussion with Dr. Irish Lack in the office. Was to start baby aspirin after his surgery. Statin dose was increased. He was to monitor his BP for me. We did a telehealth visit back in May - he was doing well. Trying to get back to exercise following his back surgery.  Last visit here was in December and he was doing ok - cholesterol levels have not been ideal.    Comes in today. Here alone. Doing well. Had a little cold over Christmas - COVID negative. No chest pain. Not short of breath. BP typically lower at home. He feels good overall. His back surgery was successful. Tolerating his medicines. Labs from August noted.   Past Medical History:  Diagnosis Date  . Arthritis   . Cancer (HCC)    HX OF MALIGNANT MELANOMA SHOULDER   . Coronary artery disease    Medical thearpy, 02/20/18 CT coronary/FFR  . Heart murmur   . History of kidney stones     "A FEW YRS AGO"  . Hypertension   . Sleep apnea    CPAP- 4 GOES TO 12 OR 14   . Thoracic  ascending aortic aneurysm (HCC)    4 cm 02/20/18    Past Surgical History:  Procedure Laterality Date  . APPENDECTOMY    . JOINT REPLACEMENT     RIGHT KNEE REPLACEMENT   . KNEE ARTHROSCOPY     LEFT   . LUMBAR LAMINECTOMY/DECOMPRESSION MICRODISCECTOMY Bilateral 03/23/2018   Procedure: Lumbar Three-Four Lumbar Four-Five Laminectomy/Foraminotomy with sublaminar decompression;  Surgeon: [name], MD;  Location: [facility];  Service: Neurosurgery;  Laterality: Bilateral;  Lumbar Three-Four Lumbar Four-Five Laminectomy/Foraminotomy with sublaminar decompression  . RIGHT KNEE SURGERY     . TOTAL KNEE ARTHROPLASTY  12/10/2011   Procedure: TOTAL KNEE ARTHROPLASTY;  Surgeon: [name], MD;  Location: [facility];  Service: Orthopedics;  Laterality: Left;  [name] VASECTOMY       Medications: Current Meds  Medication Sig  . allopurinol (ZYLOPRIM) 100 MG tablet Take 2 tablets (200 mg total) by mouth every morning.  [name] amoxicillin (AMOXIL) 500 MG capsule amoxicillin 500 mg capsule  TAKE 4 CAPSULES BY MOUTH 1 HOUR PRIOR TO DENTAL APT  . aspirin EC 81 MG tablet Take 81 mg by mouth daily.  [name] atorvastatin (LIPITOR) 20 MG tablet TAKE  1 TABLET BY MOUTH EVERY DAY  . Coenzyme Q10 (CO Q 10 PO) Take 200 mg by mouth daily.   [name] COLCRYS 0.6 MG tablet TAKE 1 TABLET (0.6 MG TOTAL) BY MOUTH DAILY AS NEEDED (GOUT).  [name] ezetimibe (ZETIA) 10 MG tablet Take 1 tablet (10 mg total) by mouth daily.  . Multiple Vitamin (MULTIVITAMIN) tablet Take 1 tablet by mouth daily.  . sildenafil (REVATIO) 20 MG tablet Take 100 mg by mouth daily as needed (ED).  . valsartan (DIOVAN) 160 MG tablet Take 1 tablet (160 mg total) by mouth daily.     Allergies: Allergies  Allergen Reactions  . Indocin [Indomethacin] Hives and Other (See Comments)    No reaction to other NSAIDs.     Social History: The patient  reports that he has never smoked. He has never used smokeless tobacco. He reports previous alcohol use. He reports that he  does not use drugs.   Family History: The patient's family history includes Arrhythmia in his mother; Bladder Cancer in his mother; Diabetes in his father; Heart failure in his father and mother; Hyperlipidemia in his mother; Peripheral Artery Disease in his mother; Prostate cancer in his father.   Review of Systems: Please see the history of present illness.   All other systems are reviewed and negative.   Physical Exam: VS:  BP 140/74   Pulse 73   Ht 5' 7.5" (1.715 m)   Wt 227 lb (103 kg)   SpO2 97%   BMI 35.03 kg/m  .  BMI Body mass index is 35.03 kg/m.  Wt Readings from Last 3 Encounters:  03/04/20 227 lb (103 kg)  02/05/19 218 lb 12.8 oz (99.2 kg)  12/14/18 225 lb 3.2 oz (102.2 kg)   BP recheck by me is 130/80.   General: Pleasant. Well developed, well nourished and in no acute distress.   Cardiac: Regular rate and rhythm. No murmurs, rubs, or gallops. No edema.  Respiratory:  Lungs are clear to auscultation bilaterally with normal work of breathing.  GI: Soft and nontender.  MS: No deformity or atrophy. Gait and ROM intact.  Skin: Warm and dry. Color is normal.  Neuro:  Strength and sensation are intact and no gross focal deficits noted.  Psych: Alert, appropriate and with normal affect.   LABORATORY DATA:  EKG:  EKG is ordered today.  Personally reviewed by me. This demonstrates NSR - HR is 73.  Lab Results  Component Value Date   WBC 7.0 03/16/2018   HGB 13.6 03/16/2018   HCT 39.9 03/16/2018   PLT 271 03/16/2018   GLUCOSE 110 (H) 09/24/2019   CHOL 130 09/24/2019   TRIG 107 09/24/2019   HDL 40 09/24/2019   LDLCALC 70 09/24/2019   ALT 31 12/03/2019   AST 26 12/03/2019   NA 142 09/24/2019   K 4.6 09/24/2019   CL 106 09/24/2019   CREATININE 1.23 09/24/2019   BUN 20 09/24/2019   CO2 21 09/24/2019   PSA 1.52 02/27/2018   INR 0.97 12/07/2011   HGBA1C 5.9 05/11/2017     BNP (last 3 results) No results for input(s): BNP in the last 8760 hours.  ProBNP  (last 3 results) No results for input(s): PROBNP in the last 8760 hours.   Other Studies Reviewed Today:  CTA CHEST IMPRESSION 01/2019: 1. Stable mild uncomplicated fusiform aneurysmal dilatation of the ascending thoracic aorta measuring 40 mm in diameter. Recommend annual imaging followup by CTA or MRA. This recommendation follows 2010 ACCF/AHA/AATS/ACR/ASA/SCA/SCAI/SIR/STS/SVM Guidelines  for the Diagnosis and Management of Patients with Thoracic Aortic Disease. Circulation. 2010; 121: L753-Y051. Aortic aneurysm NOS (ICD10-I71.9) 2. Coronary calcifications.  Aortic Atherosclerosis (ICD10-I70.0).     Electronically Signed   By: Simonne Come M.D.   On: 01/30/2019 09:14   Echo Study Conclusions 12/2017   - Left ventricle: The cavity size was normal. There was mild   concentric hypertrophy. Systolic function was normal. The   estimated ejection fraction was in the range of 60% to 65%. Wall   motion was normal; there were no regional wall motion   abnormalities. Doppler parameters are consistent with abnormal   left ventricular relaxation (grade 1 diastolic dysfunction).   Doppler parameters are consistent with indeterminate ventricular   filling pressure. - Aortic valve: Transvalvular velocity was within the normal range.   There was no stenosis. There was no regurgitation. - Aorta: Ascending aortic diameter: 41 mm (S). - Ascending aorta: The ascending aorta was mildly dilated. - Mitral valve: Transvalvular velocity was within the normal range.   There was no evidence for stenosis. There was no regurgitation. - Left atrium: The atrium was mildly dilated. - Right ventricle: The cavity size was normal. Wall thickness was   normal. Systolic function was normal. - Pulmonary arteries: Systolic pressure was within the normal   range. - Global longitudinal strain -19.3% (normal).     CT FFR 01/2018 IMPRESSION: FFR CT is abnormal in the most distal aspect of OM1 and distal  left sided PDA. Would consider medical Rx initially as abnormalities only in the most   Distal aspect of small vessels   CT CORONARY IMPRESSION 01/2018: 1.  Calcium score 137 which is 53 rd percentile for age and sex   2.  Mild aortic root dilatation 4.0 cm   3. CAD worst lesion in mid LAD prior to D2 take off 50% Study will be sent for [facility] CT   [facility]     Assessment & Plan:  1. CAD - noted by prior coronary CT - primarily in the branches of the LAD - his FFr was abnormal but being in the distal areas - medical management was recommended after discussion with Dr. Eldridge Dace at that time. He has no active symptoms. Needs to continue with aggressive CV risk factor modification.   2. HTN - recheck by me is ok - he has good control at home - no changes made today.   3. HLD - on statin and Zetia - labs from August noted.   4. Thoracic aneurysm - needs his scan updated - will arrange - BMET today.   5. Obesity - he knows what he needs to be doing. Encouragement given.    Current medicines are reviewed with the patient today.  The patient does not have concerns regarding medicines other than what has been noted above.  The following changes have been made:  See above.  Labs/ tests ordered today include:    Orders Placed This Encounter  Procedures  . CT ANGIO CHEST AORTA W/CM & OR WO/CM  . Basic metabolic panel  . EKG 12-Lead     Disposition:   FU with Dr. Duke Salvia in about 6 months. He is aware that I am leaving next month. BMET today. Will get his CTA updated for his aneurysm.    Patient is agreeable to this plan and will call if any problems develop in the interim.   SignedNorma Fredrickson, NP  03/04/2020 8:47 AM  [facility] [address] [place], [place]  [address] Phone: [phone] Fax: [phone]

## 2020-03-04 ENCOUNTER — Ambulatory Visit (INDEPENDENT_AMBULATORY_CARE_PROVIDER_SITE_OTHER): Payer: Medicare Other | Admitting: Nurse Practitioner

## 2020-03-04 ENCOUNTER — Encounter: Payer: Self-pay | Admitting: Nurse Practitioner

## 2020-03-04 ENCOUNTER — Other Ambulatory Visit: Payer: Self-pay

## 2020-03-04 VITALS — BP 140/74 | HR 73 | Ht 67.5 in | Wt 227.0 lb

## 2020-03-04 DIAGNOSIS — I259 Chronic ischemic heart disease, unspecified: Secondary | ICD-10-CM

## 2020-03-04 DIAGNOSIS — E7849 Other hyperlipidemia: Secondary | ICD-10-CM | POA: Diagnosis not present

## 2020-03-04 DIAGNOSIS — I712 Thoracic aortic aneurysm, without rupture, unspecified: Secondary | ICD-10-CM

## 2020-03-04 DIAGNOSIS — I10 Essential (primary) hypertension: Secondary | ICD-10-CM

## 2020-03-04 LAB — BASIC METABOLIC PANEL
BUN/Creatinine Ratio: 20 (ref 10–24)
BUN: 25 mg/dL (ref 8–27)
CO2: 22 mmol/L (ref 20–29)
Calcium: 9.5 mg/dL (ref 8.6–10.2)
Chloride: 103 mmol/L (ref 96–106)
Creatinine, Ser: 1.25 mg/dL (ref 0.76–1.27)
GFR calc Af Amer: 67 mL/min/{1.73_m2} (ref >59)
GFR calc non Af Amer: 58 mL/min/{1.73_m2} — ABNORMAL LOW (ref >59)
Glucose: 107 mg/dL — ABNORMAL HIGH (ref 65–99)
Potassium: 4.7 mmol/L (ref 3.5–5.2)
Sodium: 139 mmol/L (ref 134–144)

## 2020-03-04 NOTE — Patient Instructions (Addendum)
After Visit Summary:  We will be checking the following labs today - BMET   Medication Instructions:    Continue with your current medicines.    If you need a refill on your cardiac medications before your next appointment, please call your pharmacy.     Testing/Procedures To Be Arranged:  CTA of the chest/aorta  Follow-Up:   See Dr. Oval Linsey at the [facility] office about 5 to 6 months     At [facility], you and your health needs are our priority.  As part of our continuing mission to provide you with exceptional heart care, we have created designated Provider Care Teams.  These Care Teams include your primary Cardiologist (physician) and Advanced Practice Providers (APPs -  Physician Assistants and Nurse Practitioners) who all work together to provide you with the care you need, when you need it.  Special Instructions:  . Stay safe, wash your hands for at least 20 seconds and wear a mask when needed.  . It was good to talk with you today.                               Ascending Aortic Aneurysm/ Thoracic Aortic Aneurysm   Recent studies have raised concern that fluoroquinolone antibiotics could be associated with an increased risk of aortic aneurysm or aortic dissection. You should avoid use of Cipro and other associated antibiotics (flouroquinolone antibiotics )  It is  best to avoid activities that cause grunting or straining (medically referred to as a "valsalva maneuver"). This happens when a person bears down against a closed throat to increase the strength of arm or abdominal muscles. There's often a tendency to do this when lifting heavy weights, doing sit-ups, push-ups or chin-ups, etc., but it may be harmful.     An aneurysm is a bulge in an artery. It happens when blood pushes up against a weakened or damaged artery wall. A thoracic aortic aneurysm is an aneurysm that occurs in the first part of the aorta, between the heart and the diaphragm. The aorta is the  main artery of the body. It supplies blood from the heart to the rest of the body. Some aneurysms may not cause symptoms or problems. However, the major concern with a thoracic aortic aneurysm is that it can enlarge and burst (rupture), or blood can flow between the layers of the wall of the aorta through a tear (aorticdissection). Both of these conditions can cause bleeding inside the body and can be life-threatening if they are not diagnosed and treated right away. What are the causes? The exact cause of this condition is not known. What increases the risk? The following factors may make you more likely to develop this condition:  Being age 71 or older.  Having a hardening of the arteries caused by the buildup of fat and other substances in the lining of a blood vessel (arteriosclerosis).  Having inflammation of the walls of an artery (arteritis).  Having a genetic disease that weakens the body's connective tissue, such as Marfan syndrome.  Having an injury or trauma to the aorta.  Having an infection that is caused by bacteria, such as syphilis or staphylococcus, in the wall of the aorta (infectious aortitis).  Having high blood pressure (hypertension).  Being male.  Being white (Caucasian).  Having high cholesterol.  Having a family history of aneurysms.  Using tobacco.  Having chronic obstructive pulmonary disease (COPD). What are the signs  or symptoms? Symptoms of this condition vary depending on the size and rate of growth of the aneurysm. Most grow slowly and do not cause any symptoms. When symptoms do occur, they may include:  Pain in the chest, back, sides, or abdomen. The pain may vary in intensity. A sudden onset of severe pain may indicate that the aneurysm has ruptured.  Hoarseness.  Cough.  Shortness of breath.  Swallowing problems.  Swelling in the face, arms, or legs.  Fever.  Unexplained weight loss. How is this diagnosed? This condition may be  diagnosed with:  An ultrasound.  X-rays.  A CT scan.  An MRI.  Tests to check the arteries for damage or blockages (angiogram). Most unruptured thoracic aortic aneurysms cause no symptoms, so they are often found during exams for other conditions. How is this treated? Treatment for this condition depends on:  The size of the aneurysm.  How fast the aneurysm is growing.  Your age.  Risk factors for rupture. Aneurysms that are smaller than 2.2 inches (5.5 cm) may be managed by using medicines to control blood pressure, manage pain, or fight infection. You may need regular monitoring to see if the aneurysm is getting bigger. Your health care provider may recommend that you have an ultrasound every year or every 6 months. How often you need to have an ultrasound depends on the size of the aneurysm, how fast it is growing, and whether you have a family history of aneurysms. Surgical repair may be needed if your aneurysm is larger than 2.2 inches or if it is growing quickly. Follow these instructions at home: Eating and drinking   Eat a healthy diet. Your health care provider may recommend that you:  Lower your salt (sodium) intake. In some people, too much salt can raise blood pressure and increase the risk of thoracic aortic aneurysm.  Avoid foods that are high in saturated fat and cholesterol, such as red meat and dairy.  Eat a diet that is low in sugar.  Increase your fiber intake by including whole grains, vegetables, and fruits in your diet. Eating these foods may help to lower blood pressure.  Limit or avoid alcohol as recommended by your health care provider. Lifestyle   Follow instructions from your health care provider about healthy lifestyle habits. Your health care provider may recommend that you:  Do not use any products that contain nicotine or tobacco, such as cigarettes and e-cigarettes. If you need help quitting, ask your health care provider.  Keep your blood  pressure within normal limits. The target limit for most people is below 120/80. Check your blood pressure regularly. If it is high, ask your health care provider about ways that you can control it.  Keep your blood sugar (glucose) level and cholesterol levels within normal limits. Target limits for most people are:  Blood glucose level: Less than 100 mg/dL.  Total cholesterol level: Less than 200 mg/dL.  Maintain a healthy weight. Activity   Stay physically active and exercise regularly. Talk with your health care provider about how often you should exercise and ask which types of exercise are safe for you.  Avoid heavy lifting and activities that take a lot of effort (are strenuous). Ask your health care provider what activities are safe for you. General instructions   Keep all follow-up visits as told by your health care provider. This is important.  Talk with your health care provider about regular screenings to see if the aneurysm is getting bigger.  Take over-the-counter and prescription medicines only as told by your health care provider. Contact a health care provider if:  You have discomfort in your upper back, neck, or abdomen.  You have trouble swallowing.  You have a cough or hoarseness.  You have a family history of aneurysms.  You have unexplained weight loss. Get help right away if:  You have sudden, severe pain in your upper back and abdomen. This pain may move into your chest and arms.  You have shortness of breath.  You have a fever. This information is not intended to replace advice given to you by your health care provider. Make sure you discuss any questions you have with your health care provider. Document Released: 02/08/2005 Document Revised: 11/21/2015 Document Reviewed: 11/21/2015 Elsevier Interactive Patient Education  2017 [employer].   Aortic Dissection An aortic dissection happens when there is a tear in the main blood vessel of the body  (aorta). The aorta comes out of the heart, curves around, and then goes down the chest (thoracic aorta) and into the abdomen (abdominal aorta) to supply arteries with blood. The wall of the aorta has inner and outer layers. Aortic dissection occurs most often in the thoracic aorta. As the tear widens and blood flows through it, the aorta becomes "double-barreled." This means that one part of the aorta continues to carry blood to the body, but blood also flows into the tear, between the layers of the aorta. The torn part of the aorta fills with blood and swells up. This can reduce blood flow through the part of the aorta that is still supplying blood to the body. Aortic dissection is a medical emergency. What are the causes? An aortic dissection is commonly caused by weakening of the artery wall due to high blood pressure. Other causes may include:  An injury, such as from a car crash.  Birth defects that affect the heart (congenital heart defects).  Thickening of the artery walls. In some cases, the cause is not known. What increases the risk? The following factors may make you more likely to develop this condition:  Having certain medical conditions, such as:  High blood pressure (hypertension).  Hardening and narrowing of the arteries (atherosclerosis).  A genetic disorder that affects the connective tissue, such as Marfan syndrome or Ehlers-Danlos syndrome.  A condition that causes inflammation of blood vessels, such as giant cell arteritis.  Having a chest injury.  Having surgery on the aorta.  Being born with a congenital heart defect.  Being male.  Being older than age 57.  Using cocaine.  Smoking.  Lifting heavy weights or doing other types of high-intensity resistance training. What are the signs or symptoms? Signs and symptoms of aortic dissection start suddenly. The most common symptoms are:  Severe chest pain that may feel like tearing, stabbing, or sharp  pain.  Severe pain that spreads (radiates) to the back, neck, jaw, or abdomen. Other symptoms may include:  Trouble breathing.  Dizziness or fainting.  Sudden weakness on one side of the body.  Nausea or vomiting.  Trouble swallowing.  Coughing up blood.  Vomiting blood.  Clammy skin. How is this diagnosed? This condition may be diagnosed based on:  Your symptoms.  A physical exam. This may include:  Listening for abnormal blood flow sounds (murmurs) in your chest or abdomen.  Checking your pulse in your arms and legs.  Checking your blood pressure to see whether it is low or whether there is a difference between  the measurements in your right and left arm.  Electrocardiogram (ECG). This test measures the electrical activity in your heart.  Chest X-ray.  CT scan.  MRI.  Aortic angiogram. This test involves injecting dye to make it easier to see your blood vessels clearly.  Echocardiogram to study your heart using sound waves.  Blood tests. How is this treated? It is important to treat an aortic dissection as quickly as possible. Treatment may start as soon as your health care provider thinks that you have aortic dissection. Treatment depends on the location and severity of your dissection and your overall health. Treatment may include:  Medicines to lower your blood pressure.  Surgery to repair the dissected part of your aorta with artificial material (syntheticgraft).  A medical procedure to insert a stent-graft into the aorta (endovascular procedure). During this procedure, a long, thin tube (stent) is inserted into an artery near the groin (femoral artery) and moved up to the damaged part of the aorta. Then, the stent is opened to help improve blood flow and prevent future dissection. Follow these instructions at home: Activity   Avoid activities that could injure your chest or your abdomen. Ask your health care provider what activities are safe for  you.  After you have recovered, try to stay active. Ask your health care provider what activities are safe for you after recovery.  Do not lift anything that is heavier than 10 lb (4.5 kg) until your health care provider approves.  Do not drive or use heavy machinery while taking prescription pain medicine. Eating and drinking   Eat a heart-healthy diet, which includes lots of fresh fruits and vegetables, low-fat (lean) protein, and whole grains.  Check ingredients and nutrition facts on packaged foods and beverages, and avoid foods with high amounts of:  Salt (sodium).  Saturated fats (like red meat).  Trans fats (like fried food). General instructions   Take over-the-counter and prescription medicines only as told by your health care provider.  Work with your health care provider to manage your blood pressure.  Talk with your health care provider about how to manage stress.  Do not use any products that contain nicotine or tobacco, such as cigarettes and e-cigarettes. If you need help quitting, ask your health care provider.  Keep all follow-up visits as told by your health care provider. This is important. Get help right away if:  You develop any symptoms of aortic dissection after treatment, including severe pain in your chest, back, or abdomen.  You have a pain in your abdomen.  You have trouble breathing or you develop a cough.  You faint.  You develop a racing heartbeat. These symptoms may represent a serious problem that is an emergency. Do not wait to see if the symptoms will go away. Get medical help right away. Call your local emergency services (911 in the [place] Do not drive yourself to the hospital. Summary  An aortic dissection happens when there is a tear in the main blood vessel of the body (aorta). It is a medical emergency.  The most common symptom is severe pain in the chest that spreads (radiates) to the back, neck, jaw, or abdomen.  It is important  to treat an aortic dissection as quickly as possible. Treatment typically includes surgery and medicines. This information is not intended to replace advice given to you by your health care provider. Make sure you discuss any questions you have with your health care provider. Document Released: 05/18/2007 Document Revised: 12/29/2015  Document Reviewed: 12/29/2015 Elsevier Interactive Patient Education  2017 [employer].     Call the [place] office at [phone] if you have any questions, problems or concerns.

## 2020-03-14 ENCOUNTER — Other Ambulatory Visit (HOSPITAL_COMMUNITY): Payer: Medicare Other

## 2020-03-24 ENCOUNTER — Other Ambulatory Visit: Payer: Self-pay

## 2020-03-24 ENCOUNTER — Ambulatory Visit (HOSPITAL_COMMUNITY)
Admission: RE | Admit: 2020-03-24 | Discharge: 2020-03-24 | Disposition: A | Discharge status: Home / Self Care | Payer: Medicare Other | Source: Ambulatory Visit | Attending: Nurse Practitioner | Admitting: Nurse Practitioner

## 2020-03-24 DIAGNOSIS — R911 Solitary pulmonary nodule: Secondary | ICD-10-CM | POA: Diagnosis not present

## 2020-03-24 DIAGNOSIS — I712 Thoracic aortic aneurysm, without rupture, unspecified: Secondary | ICD-10-CM

## 2020-03-24 DIAGNOSIS — J9811 Atelectasis: Secondary | ICD-10-CM | POA: Diagnosis not present

## 2020-03-24 MED ORDER — IOHEXOL 350 MG/ML SOLN
75.0000 mL | Freq: Once | INTRAVENOUS | Status: AC | PRN
  Administered 2020-03-24: 75 mL via INTRAVENOUS

## 2020-04-04 ENCOUNTER — Encounter: Payer: Self-pay | Admitting: Family Medicine

## 2020-04-04 ENCOUNTER — Other Ambulatory Visit: Payer: Self-pay

## 2020-04-04 ENCOUNTER — Ambulatory Visit (INDEPENDENT_AMBULATORY_CARE_PROVIDER_SITE_OTHER): Payer: Medicare Other | Admitting: Family Medicine

## 2020-04-04 VITALS — BP 133/75 | HR 63 | Temp 98.2°F | Ht 67.5 in | Wt 225.6 lb

## 2020-04-04 DIAGNOSIS — R739 Hyperglycemia, unspecified: Secondary | ICD-10-CM

## 2020-04-04 DIAGNOSIS — E785 Hyperlipidemia, unspecified: Secondary | ICD-10-CM | POA: Diagnosis not present

## 2020-04-04 DIAGNOSIS — M109 Gout, unspecified: Secondary | ICD-10-CM

## 2020-04-04 DIAGNOSIS — R351 Nocturia: Secondary | ICD-10-CM | POA: Diagnosis not present

## 2020-04-04 DIAGNOSIS — I10 Essential (primary) hypertension: Secondary | ICD-10-CM | POA: Diagnosis not present

## 2020-04-04 LAB — LIPID PANEL
Cholesterol: 137 mg/dL (ref 0–200)
HDL: 39.1 mg/dL (ref >39.00)
LDL Cholesterol: 77 mg/dL (ref 0–99)
NonHDL: 97.6
Total CHOL/HDL Ratio: 3
Triglycerides: 101 mg/dL (ref 0.0–149.0)
VLDL: 20.2 mg/dL (ref 0.0–40.0)

## 2020-04-04 LAB — COMPREHENSIVE METABOLIC PANEL WITH GFR
ALT: 34 U/L (ref 0–53)
AST: 26 U/L (ref 0–37)
Albumin: 4.4 g/dL (ref 3.5–5.2)
Alkaline Phosphatase: 80 U/L (ref 39–117)
BUN: 25 mg/dL — ABNORMAL HIGH (ref 6–23)
CO2: 29 meq/L (ref 19–32)
Calcium: 9.3 mg/dL (ref 8.4–10.5)
Chloride: 105 meq/L (ref 96–112)
Creatinine, Ser: 1.21 mg/dL (ref 0.40–1.50)
GFR: 60.82 mL/min (ref >60.00)
Glucose, Bld: 110 mg/dL — ABNORMAL HIGH (ref 70–99)
Potassium: 4.5 meq/L (ref 3.5–5.1)
Sodium: 141 meq/L (ref 135–145)
Total Bilirubin: 0.6 mg/dL (ref 0.2–1.2)
Total Protein: 6.7 g/dL (ref 6.0–8.3)

## 2020-04-04 LAB — PSA: PSA: 1.16 ng/mL (ref 0.10–4.00)

## 2020-04-04 LAB — CBC
HCT: 44.4 % (ref 39.0–52.0)
Hemoglobin: 14.8 g/dL (ref 13.0–17.0)
MCHC: 33.3 g/dL (ref 30.0–36.0)
MCV: 87.3 fl (ref 78.0–100.0)
Platelets: 177 10*3/uL (ref 150.0–400.0)
RBC: 5.08 Mil/uL (ref 4.22–5.81)
RDW: 14.3 % (ref 11.5–15.5)
WBC: 6.8 10*3/uL (ref 4.0–10.5)

## 2020-04-04 LAB — URIC ACID: Uric Acid, Serum: 6.6 mg/dL (ref 4.0–7.8)

## 2020-04-04 LAB — TSH: TSH: 2.11 u[IU]/mL (ref 0.35–4.50)

## 2020-04-04 NOTE — Progress Notes (Signed)
   Nathan Collins is a 70 y.o. male who presents today for an office visit.  Assessment/Plan:  New/Acute Problems: Cerumen Impaction Successfully Irrigated by RMA today.   Chronic Problems Addressed Today: Hyperglycemia Check A1c.   Dyslipidemia Check lipids. Continue lipitor 20mg  daily.   Essential hypertension At goal on valsartan 160mg  daily. Check labs.   Gout No recent flares. On allopurinol 200mg  daily. Check Uric acid level today.   Preventative Healthcare Check labs. UTD on screenings. HE will check with pharmacy for shingles vaccine.   Follow up in 1 year for next office visit.     Subjective:  HPI:  See A/p.         Objective:  Physical Exam: BP 133/75   Pulse 63   Temp 98.2 F (36.8 C) (Temporal)   Ht 5' 7.5" (1.715 m)   Wt 225 lb 9.6 oz (102.3 kg)   SpO2 96%   BMI 34.81 kg/m   Gen: No acute distress, resting comfortably CV: Regular rate and rhythm with no murmurs appreciated Pulm: Normal work of breathing, clear to auscultation bilaterally with no crackles, wheezes, or rhonchi Neuro: Grossly normal, moves all extremities Psych: Normal affect and thought content      Marinda Tyer M. Jerline Pain, MD 04/04/2020 9:16 AM

## 2020-04-04 NOTE — Assessment & Plan Note (Signed)
Check lipids. Continue lipitor 20mg  daily.

## 2020-04-04 NOTE — Assessment & Plan Note (Signed)
Check A1c. 

## 2020-04-04 NOTE — Patient Instructions (Signed)
It was very nice to see you today!  We will check blood work today.  No medication changes.  Please check with your pharmacy about getting the shingles vaccine.  I will see back in year for your next annual checkup with blood work.  Please come back to see me sooner if needed.  Take care, Dr Jerline Pain  Please try these tips to maintain a healthy lifestyle:   Eat at least 3 REAL meals and 1-2 snacks per day.  Aim for no more than 5 hours between eating.  If you eat breakfast, please do so within one hour of getting up.    Each meal should contain half fruits/vegetables, one quarter protein, and one quarter carbs (no bigger than a computer mouse)   Cut down on sweet beverages. This includes juice, soda, and sweet tea.     Drink at least 1 glass of water with each meal and aim for at least 8 glasses per day   Exercise at least 150 minutes every week.    Preventive Care 28 Years and Older, Male Preventive care refers to lifestyle choices and visits with your health care provider that can promote health and wellness. This includes:  A yearly physical exam. This is also called an annual wellness visit.  Regular dental and eye exams.  Immunizations.  Screening for certain conditions.  Healthy lifestyle choices, such as: ? Eating a healthy diet. ? Getting regular exercise. ? Not using drugs or products that contain nicotine and tobacco. ? Limiting alcohol use. What can I expect for my preventive care visit? Physical exam Your health care provider will check your:  Height and weight. These may be used to calculate your BMI (body mass index). BMI is a measurement that tells if you are at a healthy weight.  Heart rate and blood pressure.  Body temperature.  Skin for abnormal spots. Counseling Your health care provider may ask you questions about your:  Past medical problems.  Family's medical history.  Alcohol, tobacco, and drug use.  Emotional  well-being.  Home life and relationship well-being.  Sexual activity.  Diet, exercise, and sleep habits.  History of falls.  Memory and ability to understand (cognition).  Work and work Statistician.  Access to firearms. What immunizations do I need? Vaccines are usually given at various ages, according to a schedule. Your health care provider will recommend vaccines for you based on your age, medical history, and lifestyle or other factors, such as travel or where you work.   What tests do I need? Blood tests  Lipid and cholesterol levels. These may be checked every 5 years, or more often depending on your overall health.  Hepatitis C test.  Hepatitis B test. Screening  Lung cancer screening. You may have this screening every year starting at age 31 if you have a 30-pack-year history of smoking and currently smoke or have quit within the past 15 years.  Colorectal cancer screening. ? All adults should have this screening starting at age 75 and continuing until age 31. ? Your health care provider may recommend screening at age 60 if you are at increased risk. ? You will have tests every 1-10 years, depending on your results and the type of screening test.  Prostate cancer screening. Recommendations will vary depending on your family history and other risks.  Genital exam to check for testicular cancer or hernias.  Diabetes screening. ? This is done by checking your blood sugar (glucose) after you have not eaten for  a while (fasting). ? You may have this done every 1-3 years.  Abdominal aortic aneurysm (AAA) screening. You may need this if you are a current or former smoker.  STD (sexually transmitted disease) testing, if you are at risk. Follow these instructions at home: Eating and drinking  Eat a diet that includes fresh fruits and vegetables, whole grains, lean protein, and low-fat dairy products. Limit your intake of foods with high amounts of sugar, saturated fats,  and salt.  Take vitamin and mineral supplements as recommended by your health care provider.  Do not drink alcohol if your health care provider tells you not to drink.  If you drink alcohol: ? Limit how much you have to 0-2 drinks a day. ? Be aware of how much alcohol is in your drink. In the [place] one drink equals one 12 oz bottle of beer (355 mL), one 5 oz glass of wine (148 mL), or one 1 oz glass of hard liquor (44 mL).   Lifestyle  Take daily care of your teeth and gums. Brush your teeth every morning and night with fluoride toothpaste. Floss one time each day.  Stay active. Exercise for at least 30 minutes 5 or more days each week.  Do not use any products that contain nicotine or tobacco, such as cigarettes, e-cigarettes, and chewing tobacco. If you need help quitting, ask your health care provider.  Do not use drugs.  If you are sexually active, practice safe sex. Use a condom or other form of protection to prevent STIs (sexually transmitted infections).  Talk with your health care provider about taking a low-dose aspirin or statin.  Find healthy ways to cope with stress, such as: ? Meditation, yoga, or listening to music. ? Journaling. ? Talking to a trusted person. ? Spending time with friends and family. Safety  Always wear your seat belt while driving or riding in a vehicle.  Do not drive: ? If you have been drinking alcohol. Do not ride with someone who has been drinking. ? When you are tired or distracted. ? While texting.  Wear a helmet and other protective equipment during sports activities.  If you have firearms in your house, make sure you follow all gun safety procedures. What's next?  Visit your health care provider once a year for an annual wellness visit.  Ask your health care provider how often you should have your eyes and teeth checked.  Stay up to date on all vaccines. This information is not intended to replace advice given to you by your  health care provider. Make sure you discuss any questions you have with your health care provider. Document Revised: 11/07/2018 Document Reviewed: 02/02/2018 Elsevier Patient Education  2021 [employer].

## 2020-04-04 NOTE — Assessment & Plan Note (Signed)
At goal on valsartan 160mg  daily. Check labs.

## 2020-04-04 NOTE — Assessment & Plan Note (Signed)
No recent flares. On allopurinol 200mg  daily. Check Uric acid level today.

## 2020-04-07 LAB — HEMOGLOBIN A1C: Hgb A1c MFr Bld: 6 % (ref 4.6–6.5)

## 2020-04-07 NOTE — Progress Notes (Signed)
Please inform patient of the following:  Labs are all STABLE. Do not need to make any changes to his treatment plan at this time. Would like for him to keep up the good work and we can recheck in a year.  Algis Greenhouse. Jerline Pain, MD 04/07/2020 10:40 AM

## 2020-05-08 ENCOUNTER — Other Ambulatory Visit: Payer: Self-pay | Admitting: Family Medicine

## 2020-06-03 ENCOUNTER — Telehealth: Payer: Self-pay | Admitting: Family Medicine

## 2020-06-03 NOTE — Telephone Encounter (Signed)
Left message for patient to call back and schedule Medicare Annual Wellness Visit (AWV) either virtually OR in office.   Last AWV 04/16/19; please schedule at anytime with [facility]Nurse Health Advisor at [facility].  This should be a 45 minute visit.

## 2020-06-30 ENCOUNTER — Other Ambulatory Visit: Payer: Self-pay | Admitting: *Deleted

## 2020-06-30 ENCOUNTER — Telehealth: Payer: Self-pay

## 2020-06-30 DIAGNOSIS — H9190 Unspecified hearing loss, unspecified ear: Secondary | ICD-10-CM

## 2020-06-30 DIAGNOSIS — H906 Mixed conductive and sensorineural hearing loss, bilateral: Secondary | ICD-10-CM | POA: Diagnosis not present

## 2020-06-30 NOTE — Telephone Encounter (Signed)
Referral placed.

## 2020-06-30 NOTE — Telephone Encounter (Signed)
Ok to send referral  

## 2020-06-30 NOTE — Telephone Encounter (Signed)
Patient called in a stated he need a referral sent in to his [place] for a hearing test. Fax number is [phone].

## 2020-06-30 NOTE — Telephone Encounter (Signed)
Ok with me. Please place any necessary orders. 

## 2020-08-01 ENCOUNTER — Other Ambulatory Visit: Payer: Self-pay

## 2020-08-01 MED ORDER — VALSARTAN 160 MG PO TABS: 160.0000 mg | ORAL_TABLET | Freq: Every day | ORAL | 2 refills | Status: DC

## 2020-08-04 NOTE — Progress Notes (Signed)
Cardiology Office Note:    Date:  08/05/2020   ID:  Nathan Collins, DOB August 12, 1950, MRN [id number]  PCP:  Vivi Barrack, MD   [facility] Providers Cardiologist:  None Cardiology APP:  Burtis Junes, NP (Inactive)     Referring MD: Vivi Barrack, MD   Chief Complaint  Patient presents with   New Patient (Initial Visit)  CC: Follow up  History of Present Illness:    Nathan Collins is a 70 y.o. male with a hx of arthritis, malignant melanoma, CAD s/p CT coronary/FFR (02/20/2018), hypertension, obstructive sleep apnea with CPAP, gout, hyperlipidemia,and thoracic ascending aortic aneurysm 4 cm (02/20/2018) here for follow up. He was last seen by Truitt Merle, NP on 03/04/2020. He initially saw her for evaluation of CAD risk factors. Prior coronary CTA 01/2018 revealed a calcium score of 137 which was 53rd percenticle and an ascending aortic aneurysm. FFR was mildly abnormal and distal OM1. He had a follow up Chest CT 02/2020 which showed his aneurysm was stable.  Today, he is accompanied by his wife. Overall he is feeling good and has no new complaints. He does not regularly check his blood pressure at home, but when he does he notes it is similar to clinical readings. For exercise, he started walking 1.5 miles yesterday and they plan to continue this. For his diet, he mostly cooks meals at home and tries to find a balance with limiting salt and fats. Currently he is taking 81 mg aspirin about once or twice a week. They stopped taking aspirin every day due to bleeding easily from minor superficial injuries. He denies any chest pain, shortness of breath, palpitations, or exertional symptoms. No headaches, lightheadedness, or syncope to report. Also has no lower extremity edema, orthopnea or PND. He is recently retired as of the end of 2021, and he has plans to exercise, lose weight and travel.   Past Medical History:  Diagnosis Date   Arthritis    Ascending aortic aneurysm ([place])  08/05/2020   Cancer ([place])    HX OF MALIGNANT MELANOMA SHOULDER    Coronary artery disease    Medical thearpy, 02/20/18 CT coronary/FFR   Heart murmur    History of kidney stones     "A FEW YRS AGO"   Hypertension    Sleep apnea    CPAP- 4 GOES TO 12 OR 14    Thoracic ascending aortic aneurysm (HCC)    4 cm 02/20/18    Past Surgical History:  Procedure Laterality Date   APPENDECTOMY     JOINT REPLACEMENT     RIGHT KNEE REPLACEMENT    KNEE ARTHROSCOPY     LEFT    LUMBAR LAMINECTOMY/DECOMPRESSION MICRODISCECTOMY Bilateral 03/23/2018   Procedure: Lumbar Three-Four Lumbar Four-Five Laminectomy/Foraminotomy with sublaminar decompression;  Surgeon: [name], MD;  Location: [facility];  Service: Neurosurgery;  Laterality: Bilateral;  Lumbar Three-Four Lumbar Four-Five Laminectomy/Foraminotomy with sublaminar decompression   RIGHT KNEE SURGERY      TOTAL KNEE ARTHROPLASTY  12/10/2011   Procedure: TOTAL KNEE ARTHROPLASTY;  Surgeon: [name], MD;  Location: [facility];  Service: Orthopedics;  Laterality: Left;   VASECTOMY      Current Medications: Current Meds  Medication Sig   allopurinol (ZYLOPRIM) 100 MG tablet TAKE 2 TABLETS (200 MG TOTAL) BY MOUTH EVERY MORNING.   amoxicillin (AMOXIL) 500 MG capsule amoxicillin 500 mg capsule  TAKE 4 CAPSULES BY MOUTH 1 HOUR PRIOR TO DENTAL APT   aspirin EC 81  MG tablet Take 81 mg by mouth once a week.   atorvastatin (LIPITOR) 20 MG tablet TAKE 1 TABLET BY MOUTH EVERY DAY   Coenzyme Q10 (CO Q 10 PO) Take 300 mg by mouth daily.   COLCRYS 0.6 MG tablet TAKE 1 TABLET (0.6 MG TOTAL) BY MOUTH DAILY AS NEEDED (GOUT).   Multiple Vitamin (MULTIVITAMIN) tablet Take 1 tablet by mouth daily.   sildenafil (REVATIO) 20 MG tablet Take 100 mg by mouth daily as needed (ED).   valsartan (DIOVAN) 160 MG tablet Take 1 tablet (160 mg total) by mouth daily.     Allergies:   Indocin [indomethacin]   Social History   Socioeconomic History   Marital status:  Married    Spouse name: Not on file   Number of children: Not on file   Years of education: Not on file   Highest education level: Not on file  Occupational History    Comment: Sales Rep   Tobacco Use   Smoking status: Never   Smokeless tobacco: Never  Vaping Use   Vaping Use: Never used  Substance and Sexual Activity   Alcohol use: Not Currently   Drug use: No   Sexual activity: Yes    Partners: Female  Other Topics Concern   Not on file  Social History Narrative   Not on file   Social Determinants of Health   Financial Resource Strain: Not on file  Food Insecurity: Not on file  Transportation Needs: Not on file  Physical Activity: Not on file  Stress: Not on file  Social Connections: Not on file     Family History: The patient's family history includes Arrhythmia in his mother; Bladder Cancer in his mother; Diabetes in his father; Heart failure in his father and mother; Hyperlipidemia in his mother; Peripheral Artery Disease in his mother; Prostate cancer in his father.  ROS:   Please see the history of present illness.    All other systems reviewed and are negative.  EKGs/Labs/Other Studies Reviewed:    The following studies were reviewed today:  CT Angio Chest 03/24/2020: 1. Stable uncomplicated mild fusiform aneurysmal dilatation of the ascending thoracic aorta measuring 40 mm in diameter, grossly unchanged compared to the 01/2018 examination. Recommend annual imaging followup by CTA or MRA. This recommendation follows 2010 ACCF/AHA/AATS/ACR/ASA/SCA/SCAI/SIR/STS/SVM Guidelines for the Diagnosis and Management of Patients with Thoracic Aortic Disease. Circulation. 2010; 121: I967-E938. Aortic aneurysm NOS (ICD10-I71.9) 2. Coronary artery calcifications. 3. Punctate (sub 5 mm) bilateral pulmonary nodules are unchanged compared to the 01/2018 examination. Continued attention on above recommended follow-up CTA would ensure greater than to the stability and thus  a benign etiology. 4. Suspected hepatic steatosis. Correlation with LFTs is advised. 5. Aortic Atherosclerosis (ICD10-I70.0).  FFR CT 02/20/2018: FFR CT is abnormal in the most distal aspect of OM1 and distal left sided PDA. Would consider medical Rx initially as abnormalities only in the most distal aspect of small vessels.  CT Coronary Morph 02/20/2018: 1.  Calcium score 137 which is 53 rd percentile for age and sex   2.  Mild aortic root dilatation 4.0 cm   3. CAD worst lesion in mid LAD prior to D2 take off 50% Study will be sent for FFR CT  Echo 01/17/2018: - Left ventricle: The cavity size was normal. There was mild    concentric hypertrophy. Systolic function was normal. The    estimated ejection fraction was in the range of 60% to 65%. Wall    motion was normal;  there were no regional wall motion    abnormalities. Doppler parameters are consistent with abnormal    left ventricular relaxation (grade 1 diastolic dysfunction).    Doppler parameters are consistent with indeterminate ventricular    filling pressure.  - Aortic valve: Transvalvular velocity was within the normal range.    There was no stenosis. There was no regurgitation.  - Aorta: Ascending aortic diameter: 41 mm (S).  - Ascending aorta: The ascending aorta was mildly dilated.  - Mitral valve: Transvalvular velocity was within the normal range.    There was no evidence for stenosis. There was no regurgitation.  - Left atrium: The atrium was mildly dilated.  - Right ventricle: The cavity size was normal. Wall thickness was    normal. Systolic function was normal.  - Pulmonary arteries: Systolic pressure was within the normal    range.  - Global longitudinal strain -19.3% (normal).  EKG:   08/05/2020: EKG is not ordered today.   Recent Labs: 04/04/2020: ALT 34; BUN 25; Creatinine, Ser 1.21; Hemoglobin 14.8; Platelets 177.0; Potassium 4.5; Sodium 141; TSH 2.11  Recent Lipid Panel    Component Value  Date/Time   CHOL 137 04/04/2020 0907   CHOL 130 09/24/2019 0958   TRIG 101.0 04/04/2020 0907   HDL 39.10 04/04/2020 0907   HDL 40 09/24/2019 0958   CHOLHDL 3 04/04/2020 0907   VLDL 20.2 04/04/2020 0907   LDLCALC 77 04/04/2020 0907   LDLCALC 70 09/24/2019 0958      Physical Exam:    VS:  BP 130/72 (BP Location: Left Arm, Patient Position: Sitting, Cuff Size: Large)   Pulse 74   Ht 5\' 7"  (1.702 m)   Wt 227 lb (103 kg)   BMI 35.55 kg/m  , BMI Body mass index is 35.55 kg/m. GENERAL:  Well appearing HEENT: Pupils equal round and reactive, fundi not visualized, oral mucosa unremarkable NECK:  No jugular venous distention, waveform within normal limits, carotid upstroke brisk and symmetric, no bruits LUNGS:  Clear to auscultation bilaterally HEART:  RRR.  PMI not displaced or sustained,S1 and S2 within normal limits, no S3, no S4, no clicks, no rubs, no murmurs ABD:  Flat, positive bowel sounds normal in frequency in pitch, no bruits, no rebound, no guarding, no midline pulsatile mass, no hepatomegaly, no splenomegaly EXT:  2 plus pulses throughout, no edema, no cyanosis no clubbing SKIN:  No rashes no nodules NEURO:  Cranial nerves II through XII grossly intact, motor grossly intact throughout PSYCH:  Cognitively intact, oriented to person place and time   ASSESSMENT:    1. Coronary artery disease involving native coronary artery of native heart without angina pectoris   2. Essential hypertension   3. Dyslipidemia   4. Ascending aortic aneurysm (HCC)    PLAN:   CAD (coronary artery disease) Non-obstructive CAD on coronary CTA.  Medically managed.  He will work on increasing his exercise.  We will also refer him to the PREP program.  Continue aspirin, atorvastatin, and ezetimibe.  His lipids are nearly at goal.  I suspect that it will improve with increased exercise and improved diet.  Essential hypertension Blood pressure is controlled on valsartan.  Continue current  regimen.  Dyslipidemia Lipids nearly at goal.  Continue Zetia and atorvastatin.  Increase exercise as above.  Ascending aortic aneurysm (HCC) Stable at 4.0 cm.  We will get an echo in 2023.  Continue blood pressure control.  Consider adding a beta-blocker.     Disposition: Follow-up with  Stormey Wilborn C. Oval Linsey, MD, [facility] in 1 year.   Medication Adjustments/Labs and Tests Ordered: Current medicines are reviewed at length with the patient today.  Concerns regarding medicines are outlined above.  No orders of the defined types were placed in this encounter.  No orders of the defined types were placed in this encounter.   Patient Instructions  Medication Instructions:  Your physician recommends that you continue on your current medications as directed. Please refer to the Current Medication list given to you today.   *If you need a refill on your cardiac medications before your next appointment, please call your pharmacy*  Lab Work: NONE  Testing/Procedures: NONE  Follow-Up: At [facility], you and your health needs are our priority.  As part of our continuing mission to provide you with exceptional heart care, we have created designated Provider Care Teams.  These Care Teams include your primary Cardiologist (physician) and Advanced Practice Providers (APPs -  Physician Assistants and Nurse Practitioners) who all work together to provide you with the care you need, when you need it.  We recommend signing up for the patient portal called "MyChart".  Sign up information is provided on this After Visit Summary.  MyChart is used to connect with patients for Virtual Visits (Telemedicine).  Patients are able to view lab/test results, encounter notes, upcoming appointments, etc.  Non-urgent messages can be sent to your provider as well.   To learn more about what you can do with MyChart, go to [email or web address].    Your next appointment:   12 month(s)  The format for your next  appointment:   In Person  Provider:   DR Alburnett ([facility]) PROGRAM WILL BE IN [facility],acting as a scribe for Skeet Latch, MD.,have documented all relevant documentation on the behalf of Skeet Latch, MD,as directed by  Skeet Latch, MD while in the presence of Skeet Latch, MD.  I, Campbell Oval Linsey, MD have reviewed all documentation for this visit.  The documentation of the exam, diagnosis, procedures, and orders on 08/05/2020 are all accurate and complete.   Signed, Skeet Latch, MD  08/05/2020 5:42 PM    [facility]

## 2020-08-05 ENCOUNTER — Other Ambulatory Visit: Payer: Self-pay

## 2020-08-05 ENCOUNTER — Ambulatory Visit (INDEPENDENT_AMBULATORY_CARE_PROVIDER_SITE_OTHER): Payer: Medicare Other | Admitting: Cardiovascular Disease

## 2020-08-05 ENCOUNTER — Encounter: Payer: Self-pay | Admitting: Cardiovascular Disease

## 2020-08-05 DIAGNOSIS — I712 Thoracic aortic aneurysm, without rupture: Secondary | ICD-10-CM

## 2020-08-05 DIAGNOSIS — E785 Hyperlipidemia, unspecified: Secondary | ICD-10-CM | POA: Diagnosis not present

## 2020-08-05 DIAGNOSIS — I10 Essential (primary) hypertension: Secondary | ICD-10-CM

## 2020-08-05 DIAGNOSIS — I259 Chronic ischemic heart disease, unspecified: Secondary | ICD-10-CM

## 2020-08-05 DIAGNOSIS — I7121 Aneurysm of the ascending aorta, without rupture: Secondary | ICD-10-CM

## 2020-08-05 DIAGNOSIS — I2510 Atherosclerotic heart disease of native coronary artery without angina pectoris: Secondary | ICD-10-CM

## 2020-08-05 HISTORY — DX: Aneurysm of the ascending aorta, without rupture: I71.21

## 2020-08-05 NOTE — Assessment & Plan Note (Signed)
Stable at 4.0 cm.  We will get an echo in 2023.  Continue blood pressure control.  Consider adding a beta-blocker.

## 2020-08-05 NOTE — Assessment & Plan Note (Signed)
Non-obstructive CAD on coronary CTA.  Medically managed.  He will work on increasing his exercise.  We will also refer him to the PREP program.  Continue aspirin, atorvastatin, and ezetimibe.  His lipids are nearly at goal.  I suspect that it will improve with increased exercise and improved diet.

## 2020-08-05 NOTE — Assessment & Plan Note (Signed)
Blood pressure is controlled on valsartan.  Continue current regimen.

## 2020-08-05 NOTE — Patient Instructions (Addendum)
Medication Instructions:  Your physician recommends that you continue on your current medications as directed. Please refer to the Current Medication list given to you today.   *If you need a refill on your cardiac medications before your next appointment, please call your pharmacy*  Lab Work: NONE  Testing/Procedures: NONE  Follow-Up: At [facility], you and your health needs are our priority.  As part of our continuing mission to provide you with exceptional heart care, we have created designated Provider Care Teams.  These Care Teams include your primary Cardiologist (physician) and Advanced Practice Providers (APPs -  Physician Assistants and Nurse Practitioners) who all work together to provide you with the care you need, when you need it.  We recommend signing up for the patient portal called "MyChart".  Sign up information is provided on this After Visit Summary.  MyChart is used to connect with patients for Virtual Visits (Telemedicine).  Patients are able to view lab/test results, encounter notes, upcoming appointments, etc.  Non-urgent messages can be sent to your provider as well.   To learn more about what you can do with MyChart, go to [email or web address].    Your next appointment:   12 month(s)  The format for your next appointment:   In Person  Provider:   DR [place]ley ([facility]) [place]

## 2020-08-05 NOTE — Assessment & Plan Note (Signed)
Lipids nearly at goal.  Continue Zetia and atorvastatin.  Increase exercise as above.

## 2020-08-08 ENCOUNTER — Telehealth: Payer: Self-pay

## 2020-08-08 NOTE — Telephone Encounter (Signed)
Received return call re: PREP program, still working and traveling during the week; unable to attend currently, but wants to explore options toward the end of the year.

## 2020-08-08 NOTE — Telephone Encounter (Signed)
Referral received for pt, left voicemail re: interest in attending PREP program.

## 2020-08-11 ENCOUNTER — Other Ambulatory Visit: Payer: Self-pay | Admitting: Family Medicine

## 2020-08-15 DIAGNOSIS — H35372 Puckering of macula, left eye: Secondary | ICD-10-CM | POA: Diagnosis not present

## 2020-08-15 DIAGNOSIS — H2513 Age-related nuclear cataract, bilateral: Secondary | ICD-10-CM | POA: Diagnosis not present

## 2020-08-15 DIAGNOSIS — H43813 Vitreous degeneration, bilateral: Secondary | ICD-10-CM | POA: Diagnosis not present

## 2020-08-15 DIAGNOSIS — H25013 Cortical age-related cataract, bilateral: Secondary | ICD-10-CM | POA: Diagnosis not present

## 2020-08-30 ENCOUNTER — Encounter (HOSPITAL_BASED_OUTPATIENT_CLINIC_OR_DEPARTMENT_OTHER): Payer: Self-pay

## 2020-08-30 MED ORDER — EZETIMIBE 10 MG PO TABS: 10.0000 mg | ORAL_TABLET | Freq: Every day | ORAL | 3 refills | Status: DC

## 2020-08-30 MED ORDER — EZETIMIBE 10 MG PO TABS: 10.0000 mg | ORAL_TABLET | Freq: Every day | ORAL | 11 refills | Status: DC

## 2020-08-30 NOTE — Addendum Note (Signed)
Addended by: Loel Dubonnet on: 08/30/2020 11:37 AM   Modules accepted: Orders

## 2020-10-10 DIAGNOSIS — M546 Pain in thoracic spine: Secondary | ICD-10-CM | POA: Diagnosis not present

## 2020-10-10 DIAGNOSIS — R202 Paresthesia of skin: Secondary | ICD-10-CM | POA: Diagnosis not present

## 2020-10-10 DIAGNOSIS — M542 Cervicalgia: Secondary | ICD-10-CM | POA: Diagnosis not present

## 2020-10-30 DIAGNOSIS — G4733 Obstructive sleep apnea (adult) (pediatric): Secondary | ICD-10-CM | POA: Diagnosis not present

## 2020-11-02 ENCOUNTER — Other Ambulatory Visit: Payer: Self-pay | Admitting: Family Medicine

## 2020-11-03 MED ORDER — ALLOPURINOL 100 MG PO TABS: 200.0000 mg | ORAL_TABLET | Freq: Every morning | ORAL | 0 refills | Status: DC

## 2020-11-13 ENCOUNTER — Other Ambulatory Visit: Payer: Self-pay | Admitting: Family Medicine

## 2020-12-20 ENCOUNTER — Encounter (HOSPITAL_BASED_OUTPATIENT_CLINIC_OR_DEPARTMENT_OTHER): Payer: Self-pay

## 2020-12-22 MED ORDER — ATORVASTATIN CALCIUM 20 MG PO TABS: 20.0000 mg | ORAL_TABLET | Freq: Every day | ORAL | 2 refills | Status: DC

## 2020-12-26 ENCOUNTER — Other Ambulatory Visit: Payer: Self-pay | Admitting: Cardiovascular Disease

## 2020-12-29 NOTE — Telephone Encounter (Signed)
Refill sent:   atorvastatin (LIPITOR) 20 MG tablet [[id number]] 12/26/20 Dispense Quantity: 90 tablet Refills: 3 Sig: TAKE ONE TABLET BY MOUTH ONE TIME DAILY,  Ordering and Authorizing Provider:  Skeet Latch, MD  [address] [place], [place] [address] Phone:  [phone]   Fax:  [phone]

## 2021-01-04 ENCOUNTER — Encounter: Payer: Self-pay | Admitting: Family Medicine

## 2021-01-30 ENCOUNTER — Other Ambulatory Visit: Payer: Self-pay | Admitting: Family Medicine

## 2021-01-31 DIAGNOSIS — Z23 Encounter for immunization: Secondary | ICD-10-CM | POA: Diagnosis not present

## 2021-02-12 DIAGNOSIS — M25512 Pain in left shoulder: Secondary | ICD-10-CM | POA: Diagnosis not present

## 2021-03-18 ENCOUNTER — Telehealth: Payer: Self-pay | Admitting: Family Medicine

## 2021-03-18 NOTE — Telephone Encounter (Signed)
Copied from [place] [id number]. Topic: Medicare AWV >> Mar 18, 2021 11:10 AM Harris-Coley, Hannah Beat wrote: Reason for CRM: Left message for patient to schedule Annual Wellness Visit.  Please schedule with Nurse Health Advisor Charlott Rakes, RN at [facility].  Please call [phone] ask for [facility]

## 2021-04-06 ENCOUNTER — Encounter (HOSPITAL_BASED_OUTPATIENT_CLINIC_OR_DEPARTMENT_OTHER): Payer: Self-pay | Admitting: Cardiovascular Disease

## 2021-04-06 ENCOUNTER — Ambulatory Visit (INDEPENDENT_AMBULATORY_CARE_PROVIDER_SITE_OTHER): Payer: Medicare Other | Admitting: Family Medicine

## 2021-04-06 ENCOUNTER — Other Ambulatory Visit: Payer: Self-pay

## 2021-04-06 ENCOUNTER — Encounter: Payer: Self-pay | Admitting: Family Medicine

## 2021-04-06 VITALS — BP 147/90 | HR 65 | Temp 99.1°F | Ht 67.0 in | Wt 227.0 lb

## 2021-04-06 DIAGNOSIS — E785 Hyperlipidemia, unspecified: Secondary | ICD-10-CM

## 2021-04-06 DIAGNOSIS — R739 Hyperglycemia, unspecified: Secondary | ICD-10-CM | POA: Diagnosis not present

## 2021-04-06 DIAGNOSIS — Z85820 Personal history of malignant melanoma of skin: Secondary | ICD-10-CM | POA: Diagnosis not present

## 2021-04-06 DIAGNOSIS — M109 Gout, unspecified: Secondary | ICD-10-CM | POA: Diagnosis not present

## 2021-04-06 DIAGNOSIS — I10 Essential (primary) hypertension: Secondary | ICD-10-CM

## 2021-04-06 DIAGNOSIS — R351 Nocturia: Secondary | ICD-10-CM

## 2021-04-06 LAB — CBC
HCT: 43.9 % (ref 39.0–52.0)
Hemoglobin: 14.5 g/dL (ref 13.0–17.0)
MCHC: 33.1 g/dL (ref 30.0–36.0)
MCV: 88.4 fl (ref 78.0–100.0)
Platelets: 156 10*3/uL (ref 150.0–400.0)
RBC: 4.97 Mil/uL (ref 4.22–5.81)
RDW: 14.4 % (ref 11.5–15.5)
WBC: 6.2 10*3/uL (ref 4.0–10.5)

## 2021-04-06 LAB — TSH: TSH: 2.48 u[IU]/mL (ref 0.35–5.50)

## 2021-04-06 LAB — HEMOGLOBIN A1C: Hgb A1c MFr Bld: 5.9 % (ref 4.6–6.5)

## 2021-04-06 LAB — PSA: PSA: 1.27 ng/mL (ref 0.10–4.00)

## 2021-04-06 NOTE — Progress Notes (Signed)
° °  Nathan Collins is a 71 y.o. male who presents today for an office visit.  Assessment/Plan:  Chronic Problems Addressed Today: Hyperglycemia Discussed lifestyle modifications.  Check A1c.  Dyslipidemia On Lipitor 20 mg daily and Zetia 10 mg daily.  We will check lipids today.  History of melanoma Will place referral to dermatology.  Essential hypertension At goal per JNC 8 on valsartan 160 mg daily.  We discussed lifestyle modifications.  Check labs today.  Gout Check uric acid level.  Continue allopurinol 200 mg daily.  Preventative Healthcare UTD on colonoscopy.  We will check labs today.  He will look into getting shingles vaccine at the pharmacy.    Subjective:  HPI:  He is here for annual follow-up today.  Has no acute concerns.  See A/p for status of chronic conditions.         Objective:  Physical Exam: BP (!) 147/90 (BP Location: Left Arm)    Pulse 65    Temp 99.1 F (37.3 C) (Temporal)    Ht 5\' 7"  (1.702 m)    Wt 227 lb (103 kg)    SpO2 95%    BMI 35.55 kg/m   Gen: No acute distress, resting comfortably CV: Regular rate and rhythm with no murmurs appreciated Pulm: Normal work of breathing, clear to auscultation bilaterally with no crackles, wheezes, or rhonchi Neuro: Grossly normal, moves all extremities Psych: Normal affect and thought content      Candyce Gambino M. Jerline Pain, MD 04/06/2021 8:41 AM

## 2021-04-06 NOTE — Assessment & Plan Note (Signed)
Check uric acid level.  Continue allopurinol 200 mg daily.

## 2021-04-06 NOTE — Patient Instructions (Signed)
It was very nice to see you today!  We we will check blood work today.  I will refer you to see dermatologist.  Please continue work on diet and exercise.  We will see back in 1 year for your next checkup.  Please come back to see [place] sooner if needed.  Take care, Dr Jerline Pain  PLEASE NOTE:  If you had any lab tests please let us know if you have not heard back within a few days. You may see your results on mychart before we have a chance to review them but we will give you a call once they are reviewed by [place]. If we ordered any referrals today, please let us know if you have not heard from their office within the next week.   Please try these tips to maintain a healthy lifestyle:  Eat at least 3 REAL meals and 1-2 snacks per day.  Aim for no more than 5 hours between eating.  If you eat breakfast, please do so within one hour of getting up.   Each meal should contain half fruits/vegetables, one quarter protein, and one quarter carbs (no bigger than a computer mouse)  Cut down on sweet beverages. This includes juice, soda, and sweet tea.   Drink at least 1 glass of water with each meal and aim for at least 8 glasses per day  Exercise at least 150 minutes every week.

## 2021-04-06 NOTE — Assessment & Plan Note (Signed)
Will place referral to dermatology. . °

## 2021-04-06 NOTE — Assessment & Plan Note (Signed)
At goal per JNC 8 on valsartan 160 mg daily.  We discussed lifestyle modifications.  Check labs today.

## 2021-04-06 NOTE — Assessment & Plan Note (Signed)
Discussed lifestyle modifications. Check A1c.  

## 2021-04-06 NOTE — Assessment & Plan Note (Signed)
On Lipitor 20 mg daily and Zetia 10 mg daily.  We will check lipids today.

## 2021-04-07 LAB — LIPID PANEL
Cholesterol: 132 mg/dL (ref 0–200)
HDL: 42.8 mg/dL (ref >39.00)
LDL Cholesterol: 72 mg/dL (ref 0–99)
NonHDL: 88.87
Total CHOL/HDL Ratio: 3
Triglycerides: 86 mg/dL (ref 0.0–149.0)
VLDL: 17.2 mg/dL (ref 0.0–40.0)

## 2021-04-07 LAB — COMPREHENSIVE METABOLIC PANEL
ALT: 29 U/L (ref 0–53)
AST: 26 U/L (ref 0–37)
Albumin: 4.4 g/dL (ref 3.5–5.2)
Alkaline Phosphatase: 81 U/L (ref 39–117)
BUN: 25 mg/dL — ABNORMAL HIGH (ref 6–23)
CO2: 29 mEq/L (ref 19–32)
Calcium: 9.4 mg/dL (ref 8.4–10.5)
Chloride: 108 mEq/L (ref 96–112)
Creatinine, Ser: 1.3 mg/dL (ref 0.40–1.50)
GFR: 55.41 mL/min — ABNORMAL LOW (ref >60.00)
Glucose, Bld: 110 mg/dL — ABNORMAL HIGH (ref 70–99)
Potassium: 4.6 mEq/L (ref 3.5–5.1)
Sodium: 144 mEq/L (ref 135–145)
Total Bilirubin: 0.6 mg/dL (ref 0.2–1.2)
Total Protein: 6.3 g/dL (ref 6.0–8.3)

## 2021-04-07 LAB — URIC ACID: Uric Acid, Serum: 6.5 mg/dL (ref 4.0–7.8)

## 2021-04-09 NOTE — Progress Notes (Signed)
Please inform patient of the following:  His blood sugar is borderline but everything else is stable. Do not need to make any changes to his treatment plan at this time. He should continue to work on diet and exercise. We can recheck in 1 year.

## 2021-04-24 NOTE — Addendum Note (Signed)
Encounter addended by: Annie Paras on: 04/24/2021 12:35 PM  Actions taken: Letter saved

## 2021-04-26 ENCOUNTER — Other Ambulatory Visit: Payer: Self-pay | Admitting: Family Medicine

## 2021-04-28 ENCOUNTER — Other Ambulatory Visit: Payer: Self-pay

## 2021-04-28 MED ORDER — VALSARTAN 160 MG PO TABS: 160.0000 mg | ORAL_TABLET | Freq: Every day | ORAL | 0 refills | Status: DC

## 2021-05-01 ENCOUNTER — Encounter: Payer: Self-pay | Admitting: Family Medicine

## 2021-05-01 NOTE — Telephone Encounter (Signed)
Called pt and verified DOB, Patient is wanting to know is this something he needs to schedule a VV for to discuss? He's not sure why he's just getting a letter form [place] regarding this.  ?

## 2021-05-05 DIAGNOSIS — Z20822 Contact with and (suspected) exposure to covid-19: Secondary | ICD-10-CM | POA: Diagnosis not present

## 2021-05-05 NOTE — Telephone Encounter (Signed)
Please advise 

## 2021-05-05 NOTE — Telephone Encounter (Signed)
Looks like they saw benign appearing nodules on his CT scan that they will recheck next year on his next CT scan. We don't need to do anything else at this time. ? ?Nathan Collins. Jerline Pain, MD ?05/05/2021 8:24 AM  ? ?

## 2021-05-28 ENCOUNTER — Other Ambulatory Visit (HOSPITAL_BASED_OUTPATIENT_CLINIC_OR_DEPARTMENT_OTHER): Payer: Self-pay | Admitting: *Deleted

## 2021-05-28 DIAGNOSIS — I7121 Aneurysm of the ascending aorta, without rupture: Secondary | ICD-10-CM

## 2021-06-11 DIAGNOSIS — G4733 Obstructive sleep apnea (adult) (pediatric): Secondary | ICD-10-CM | POA: Diagnosis not present

## 2021-06-17 DIAGNOSIS — Z08 Encounter for follow-up examination after completed treatment for malignant neoplasm: Secondary | ICD-10-CM | POA: Diagnosis not present

## 2021-06-17 DIAGNOSIS — L538 Other specified erythematous conditions: Secondary | ICD-10-CM | POA: Diagnosis not present

## 2021-06-17 DIAGNOSIS — Z85820 Personal history of malignant melanoma of skin: Secondary | ICD-10-CM | POA: Diagnosis not present

## 2021-06-17 DIAGNOSIS — L728 Other follicular cysts of the skin and subcutaneous tissue: Secondary | ICD-10-CM | POA: Diagnosis not present

## 2021-06-17 DIAGNOSIS — D492 Neoplasm of unspecified behavior of bone, soft tissue, and skin: Secondary | ICD-10-CM | POA: Diagnosis not present

## 2021-06-17 DIAGNOSIS — L821 Other seborrheic keratosis: Secondary | ICD-10-CM | POA: Diagnosis not present

## 2021-06-17 DIAGNOSIS — L570 Actinic keratosis: Secondary | ICD-10-CM | POA: Diagnosis not present

## 2021-06-17 DIAGNOSIS — L718 Other rosacea: Secondary | ICD-10-CM | POA: Diagnosis not present

## 2021-06-17 DIAGNOSIS — L814 Other melanin hyperpigmentation: Secondary | ICD-10-CM | POA: Diagnosis not present

## 2021-06-17 DIAGNOSIS — D2239 Melanocytic nevi of other parts of face: Secondary | ICD-10-CM | POA: Diagnosis not present

## 2021-06-17 DIAGNOSIS — D225 Melanocytic nevi of trunk: Secondary | ICD-10-CM | POA: Diagnosis not present

## 2021-06-29 DIAGNOSIS — Z20822 Contact with and (suspected) exposure to covid-19: Secondary | ICD-10-CM | POA: Diagnosis not present

## 2021-07-01 ENCOUNTER — Encounter: Payer: Self-pay | Admitting: Family Medicine

## 2021-07-01 ENCOUNTER — Other Ambulatory Visit: Payer: Self-pay | Admitting: Family Medicine

## 2021-07-01 MED ORDER — SILDENAFIL CITRATE 20 MG PO TABS: 100.0000 mg | ORAL_TABLET | Freq: Every day | ORAL | 5 refills | Status: DC | PRN

## 2021-07-01 NOTE — Telephone Encounter (Signed)
Last refill by historical provider  

## 2021-07-01 NOTE — Telephone Encounter (Signed)
Rx sent in for 10 tablets, pt requested 60; please advise ?

## 2021-07-02 ENCOUNTER — Other Ambulatory Visit: Payer: Self-pay

## 2021-07-02 MED ORDER — SILDENAFIL CITRATE 20 MG PO TABS: 100.0000 mg | ORAL_TABLET | Freq: Every day | ORAL | 2 refills | Status: DC | PRN

## 2021-07-02 NOTE — Telephone Encounter (Signed)
New Rx sent to pharmacy

## 2021-07-02 NOTE — Telephone Encounter (Signed)
Ok to send in new rx. ? ?Nathan Collins. Jerline Pain, MD ?07/02/2021 8:00 AM  ? ?

## 2021-07-09 ENCOUNTER — Ambulatory Visit (HOSPITAL_BASED_OUTPATIENT_CLINIC_OR_DEPARTMENT_OTHER): Payer: Medicare Other | Admitting: Cardiovascular Disease

## 2021-07-24 ENCOUNTER — Other Ambulatory Visit: Payer: Self-pay | Admitting: Cardiovascular Disease

## 2021-07-24 ENCOUNTER — Other Ambulatory Visit: Payer: Self-pay | Admitting: Family Medicine

## 2021-07-24 ENCOUNTER — Ambulatory Visit (INDEPENDENT_AMBULATORY_CARE_PROVIDER_SITE_OTHER): Payer: Medicare Other

## 2021-07-24 DIAGNOSIS — Z0000 Encounter for general adult medical examination without abnormal findings: Secondary | ICD-10-CM

## 2021-07-24 NOTE — Patient Instructions (Signed)
Nathan Collins , Thank you for taking time to come for your Medicare Wellness Visit. I appreciate your ongoing commitment to your health goals. Please review the following plan we discussed and let me know if I can assist you in the future.   Screening recommendations/referrals: Colonoscopy: Done 03/04/17 repeat every 10 years  Recommended yearly ophthalmology/optometry visit for glaucoma screening and checkup Recommended yearly dental visit for hygiene and checkup  Vaccinations: Influenza vaccine: Done 01/11/21 repeat every year  Pneumococcal vaccine: Up to date Tdap vaccine: Done 04/26/16 repeat every 10 years  Shingles vaccine: Shingrix discussed. Please contact your pharmacy for coverage information.    Covid-19: Completed 1/21, 2/11, 11/29/19 & 01/31/21  Advanced directives: Please bring a copy of your health care power of attorney and living will to the office at your convenience.  Conditions/risks identified: Lose weight   Next appointment: Follow up in one year for your annual wellness visit.   Preventive Care 11 Years and Older, Male Preventive care refers to lifestyle choices and visits with your health care provider that can promote health and wellness. What does preventive care include? A yearly physical exam. This is also called an annual well check. Dental exams once or twice a year. Routine eye exams. Ask your health care provider how often you should have your eyes checked. Personal lifestyle choices, including: Daily care of your teeth and gums. Regular physical activity. Eating a healthy diet. Avoiding tobacco and drug use. Limiting alcohol use. Practicing safe sex. Taking low doses of aspirin every day. Taking vitamin and mineral supplements as recommended by your health care provider. What happens during an annual well check? The services and screenings done by your health care provider during your annual well check will depend on your age, overall health, lifestyle  risk factors, and family history of disease. Counseling  Your health care provider may ask you questions about your: Alcohol use. Tobacco use. Drug use. Emotional well-being. Home and relationship well-being. Sexual activity. Eating habits. History of falls. Memory and ability to understand (cognition). Work and work Statistician. Screening  You may have the following tests or measurements: Height, weight, and BMI. Blood pressure. Lipid and cholesterol levels. These may be checked every 5 years, or more frequently if you are over 57 years old. Skin check. Lung cancer screening. You may have this screening every year starting at age 74 if you have a 30-pack-year history of smoking and currently smoke or have quit within the past 15 years. Fecal occult blood test (FOBT) of the stool. You may have this test every year starting at age 98. Flexible sigmoidoscopy or colonoscopy. You may have a sigmoidoscopy every 5 years or a colonoscopy every 10 years starting at age 52. Prostate cancer screening. Recommendations will vary depending on your family history and other risks. Hepatitis C blood test. Hepatitis B blood test. Sexually transmitted disease (STD) testing. Diabetes screening. This is done by checking your blood sugar (glucose) after you have not eaten for a while (fasting). You may have this done every 1-3 years. Abdominal aortic aneurysm (AAA) screening. You may need this if you are a current or former smoker. Osteoporosis. You may be screened starting at age 55 if you are at high risk. Talk with your health care provider about your test results, treatment options, and if necessary, the need for more tests. Vaccines  Your health care provider may recommend certain vaccines, such as: Influenza vaccine. This is recommended every year. Tetanus, diphtheria, and acellular pertussis (Tdap, Td)  vaccine. You may need a Td booster every 10 years. Zoster vaccine. You may need this after age  50. Pneumococcal 13-valent conjugate (PCV13) vaccine. One dose is recommended after age 73. Pneumococcal polysaccharide (PPSV23) vaccine. One dose is recommended after age 58. Talk to your health care provider about which screenings and vaccines you need and how often you need them. This information is not intended to replace advice given to you by your health care provider. Make sure you discuss any questions you have with your health care provider. Document Released: 03/07/2015 Document Revised: 10/29/2015 Document Reviewed: 12/10/2014 Elsevier Interactive Patient Education  2017 [place] Prevention in the Home Falls can cause injuries. They can happen to people of all ages. There are many things you can do to make your home safe and to help prevent falls. What can I do on the outside of my home? Regularly fix the edges of walkways and driveways and fix any cracks. Remove anything that might make you trip as you walk through a door, such as a raised step or threshold. Trim any bushes or trees on the path to your home. Use bright outdoor lighting. Clear any walking paths of anything that might make someone trip, such as rocks or tools. Regularly check to see if handrails are loose or broken. Make sure that both sides of any steps have handrails. Any raised decks and porches should have guardrails on the edges. Have any leaves, snow, or ice cleared regularly. Use sand or salt on walking paths during winter. Clean up any spills in your garage right away. This includes oil or grease spills. What can I do in the bathroom? Use night lights. Install grab bars by the toilet and in the tub and shower. Do not use towel bars as grab bars. Use non-skid mats or decals in the tub or shower. If you need to sit down in the shower, use a plastic, non-slip stool. Keep the floor dry. Clean up any water that spills on the floor as soon as it happens. Remove soap buildup in the tub or shower  regularly. Attach bath mats securely with double-sided non-slip rug tape. Do not have throw rugs and other things on the floor that can make you trip. What can I do in the bedroom? Use night lights. Make sure that you have a light by your bed that is easy to reach. Do not use any sheets or blankets that are too big for your bed. They should not hang down onto the floor. Have a firm chair that has side arms. You can use this for support while you get dressed. Do not have throw rugs and other things on the floor that can make you trip. What can I do in the kitchen? Clean up any spills right away. Avoid walking on wet floors. Keep items that you use a lot in easy-to-reach places. If you need to reach something above you, use a strong step stool that has a grab bar. Keep electrical cords out of the way. Do not use floor polish or wax that makes floors slippery. If you must use wax, use non-skid floor wax. Do not have throw rugs and other things on the floor that can make you trip. What can I do with my stairs? Do not leave any items on the stairs. Make sure that there are handrails on both sides of the stairs and use them. Fix handrails that are broken or loose. Make sure that handrails are as long  as the stairways. Check any carpeting to make sure that it is firmly attached to the stairs. Fix any carpet that is loose or worn. Avoid having throw rugs at the top or bottom of the stairs. If you do have throw rugs, attach them to the floor with carpet tape. Make sure that you have a light switch at the top of the stairs and the bottom of the stairs. If you do not have them, ask someone to add them for you. What else can I do to help prevent falls? Wear shoes that: Do not have high heels. Have rubber bottoms. Are comfortable and fit you well. Are closed at the toe. Do not wear sandals. If you use a stepladder: Make sure that it is fully opened. Do not climb a closed stepladder. Make sure that  both sides of the stepladder are locked into place. Ask someone to hold it for you, if possible. Clearly mark and make sure that you can see: Any grab bars or handrails. First and last steps. Where the edge of each step is. Use tools that help you move around (mobility aids) if they are needed. These include: Canes. Walkers. Scooters. Crutches. Turn on the lights when you go into a dark area. Replace any light bulbs as soon as they burn out. Set up your furniture so you have a clear path. Avoid moving your furniture around. If any of your floors are uneven, fix them. If there are any pets around you, be aware of where they are. Review your medicines with your doctor. Some medicines can make you feel dizzy. This can increase your chance of falling. Ask your doctor what other things that you can do to help prevent falls. This information is not intended to replace advice given to you by your health care provider. Make sure you discuss any questions you have with your health care provider. Document Released: 12/05/2008 Document Revised: 07/17/2015 Document Reviewed: 03/15/2014 Elsevier Interactive Patient Education  2017 [employer].

## 2021-07-24 NOTE — Telephone Encounter (Signed)
Rx(s) sent to pharmacy electronically.  

## 2021-07-24 NOTE — Progress Notes (Signed)
Virtual Visit via Telephone Note  I connected with  [name] on 07/24/21 at  9:00 AM EDT by telephone and verified that I am speaking with the correct person using two identifiers.  Medicare Annual Wellness visit completed telephonically due to Covid-19 pandemic.   Persons participating in this call: This Health Coach and this patient.   Location: Patient: home Provider: office    I discussed the limitations, risks, security and privacy concerns of performing an evaluation and management service by telephone and the availability of in person appointments. The patient expressed understanding and agreed to proceed.  Unable to perform video visit due to video visit attempted and failed and/or patient does not have video capability.   Some vital signs may be absent or patient reported.   Nathan Brace, LPN   Subjective:   Nathan Collins is a 71 y.o. male who presents for Medicare Annual/Subsequent preventive examination.  Review of Systems     Cardiac Risk Factors include: advanced age (>40mn, >>23women);hypertension;dyslipidemia;male gender;obesity (BMI >30kg/m2)     Objective:    There were no vitals filed for this visit. There is no height or weight on file to calculate BMI.     07/24/2021    8:56 AM 04/16/2019    9:33 AM 03/16/2018    9:18 AM 12/10/2011    4:30 PM 12/07/2011    9:37 AM  Advanced Directives  Does Patient Have a Medical Advance Directive? Yes Yes Yes Patient has advance directive, copy not in chart Patient has advance directive, copy not in chart  Type of Advance Directive Healthcare Power of Attorney Living will;Healthcare [place]  Does patient want to make changes to medical advance directive?  No - Patient declined     [place] of [place]? No - copy requested No - copy requested     Pre-existing out of facility DNR order  (yellow form or pink MOST form)    No No    Current Medications (verified) Outpatient Encounter Medications as of 07/24/2021  Medication Sig   allopurinol (ZYLOPRIM) 100 MG tablet TAKE TWO TABLETS BY MOUTH IN THE MORNING   amoxicillin (AMOXIL) 500 MG capsule amoxicillin 500 mg capsule  TAKE 4 CAPSULES BY MOUTH 1 HOUR PRIOR TO DENTAL APT   aspirin EC 81 MG tablet Take 81 mg by mouth once a week.   atorvastatin (LIPITOR) 20 MG tablet TAKE ONE TABLET BY MOUTH ONE TIME DAILY   Coenzyme Q10 (CO Q 10 PO) Take 300 mg by mouth daily.   COLCRYS 0.6 MG tablet TAKE 1 TABLET (0.6 MG TOTAL) BY MOUTH DAILY AS NEEDED (GOUT).   ezetimibe (ZETIA) 10 MG tablet Take 1 tablet (10 mg total) by mouth daily.   Multiple Vitamin (MULTIVITAMIN) tablet Take 1 tablet by mouth daily.   sildenafil (REVATIO) 20 MG tablet Take 5 tablets (100 mg total) by mouth daily as needed (ED).   valsartan (DIOVAN) 160 MG tablet Take 1 tablet (160 mg total) by mouth daily.   No facility-administered encounter medications on file as of 07/24/2021.    Allergies (verified) Indocin [indomethacin]   History: Past Medical History:  Diagnosis Date   Arthritis    [place]   Cancer ([place]    HX OF MALIGNANT MELANOMA SHOULDER    Coronary artery disease    Medical thearpy, 02/20/18 CT coronary/FFR   Heart murmur  History of kidney stones     "A FEW YRS AGO"   Hypertension    Sleep apnea    CPAP- 4 GOES TO 12 OR 14    Thoracic ascending aortic aneurysm (HCC)    4 cm 02/20/18   Past Surgical History:  Procedure Laterality Date   APPENDECTOMY     JOINT REPLACEMENT     RIGHT KNEE REPLACEMENT    KNEE ARTHROSCOPY     LEFT    LUMBAR LAMINECTOMY/DECOMPRESSION MICRODISCECTOMY Bilateral 03/23/2018   Procedure: Lumbar Three-Four Lumbar Four-Five Laminectomy/Foraminotomy with sublaminar decompression;  Surgeon: [name], MD;  Location: [facility];  Service: Neurosurgery;  Laterality: Bilateral;  Lumbar  Three-Four Lumbar Four-Five Laminectomy/Foraminotomy with sublaminar decompression   RIGHT KNEE SURGERY      TOTAL KNEE ARTHROPLASTY  12/10/2011   Procedure: TOTAL KNEE ARTHROPLASTY;  Surgeon: [name], MD;  Location: [facility];  Service: Orthopedics;  Laterality: Left;   VASECTOMY     Family History  Problem Relation Age of Onset   Diabetes Father        deceased 05/16/2008   Prostate cancer Father        cabg    Heart failure Father    Bladder Cancer Mother        pacemaker 2010/05/17   Peripheral Artery Disease Mother    Arrhythmia Mother    Heart failure Mother    Hyperlipidemia Mother    Social History   Socioeconomic History   Marital status: Married    Spouse name: Not on file   Number of children: Not on file   Years of education: Not on file   Highest education level: Not on file  Occupational History    Comment: Sales Rep   Tobacco Use   Smoking status: Never   Smokeless tobacco: Never  Vaping Use   Vaping Use: Never used  Substance and Sexual Activity   Alcohol use: Not Currently   Drug use: No   Sexual activity: Yes    Partners: Female  Other Topics Concern   Not on file  Social History Narrative   Not on file   Social Determinants of Health   Financial Resource Strain: Low Risk    Difficulty of Paying Living Expenses: Not hard at all  Food Insecurity: No Food Insecurity   Worried About Charity fundraiser in the Last Year: Never true   [place] in the Last Year: Never true  Transportation Needs: No Transportation Needs   Lack of Transportation (Medical): No   Lack of Transportation (Non-Medical): No  Physical Activity: Insufficiently Active   Days of Exercise per Week: 3 days   Minutes of Exercise per Session: 30 min  Stress: No Stress Concern Present   Feeling of Stress : Not at all  Social Connections: Moderately Integrated   Frequency of Communication with Friends and Family: More than three times a week   Frequency of Social Gatherings with  Friends and Family: More than three times a week   Attends Religious Services: More than 4 times per year   Active Member of [employer] or Organizations: No   Attends Archivist Meetings: Never   Marital Status: Married    Tobacco Counseling Counseling given: Not Answered   Clinical Intake:  Pre-visit preparation completed: Yes  Pain : No/denies pain     BMI - recorded: 32.63 Nutritional Status: BMI > 30  Obese Nutritional Risks: None Diabetes: No  How often do you need  to have someone help you when you read instructions, pamphlets, or other written materials from your doctor or pharmacy?: 1 - Never  Diabetic?no  Interpreter Needed?: No  Information entered by :: Charlott Rakes, LPN   Activities of Daily Living    07/24/2021    8:57 AM  In your present state of health, do you have any difficulty performing the following activities:  Hearing? 1  Comment wears hearing aids  Vision? 0  Difficulty concentrating or making decisions? 0  Walking or climbing stairs? 0  Dressing or bathing? 0  Doing errands, shopping? 0  Preparing Food and eating ? N  Using the Toilet? N  In the past six months, have you accidently leaked urine? N  Do you have problems with loss of bowel control? N  Managing your Medications? N  Managing your Finances? N  Housekeeping or managing your Housekeeping? N    Patient Care Team: Vivi Barrack, MD as PCP - General (Family Medicine) Roseanne Kaufman, MD as Consulting Physician (Orthopedic Surgery) Burtis Junes, NP (Inactive) as Nurse Practitioner (Cardiology) Hortencia Pilar, MD as Consulting Physician (Ophthalmology) [name], MD as Consulting Physician (Neurosurgery) Marius Ditch, MD as Consulting Physician (Sleep Medicine)  Indicate any recent Medical Services you may have received from other than [facility] providers in the past year (date may be approximate).     Assessment:   This is a routine wellness  examination for Richardson.  Hearing/Vision screen Hearing Screening - Comments:: Pt wears hearing aids  Vision Screening - Comments:: Pt follows up with Dr Herbert Deaner for annual eye exams   Dietary issues and exercise activities discussed: Current Exercise Habits: Home exercise routine, Type of exercise: Other - see comments, Time (Minutes): 30, Frequency (Times/Week): 3, Weekly Exercise (Minutes/Week): 90   Goals Addressed             This Visit's Progress    Patient Stated       Lose weight        Depression Screen    07/24/2021    8:55 AM 04/16/2019    9:34 AM 12/14/2018   11:28 AM 10/11/2017    8:34 AM  PHQ 2/9 Scores  PHQ - 2 Score 0 0 0 0    Fall Risk    07/24/2021    8:57 AM 04/16/2019    9:34 AM 10/11/2017    8:34 AM  Fall Risk   Falls in the past year? 0 0 No  Number falls in past yr: 0 0   Injury with Fall? 0 0   Risk for fall due to : Impaired vision    Follow up Falls prevention discussed Falls evaluation completed;Education provided;Falls prevention discussed     FALL RISK PREVENTION PERTAINING TO THE HOME:  Any stairs in or around the home? Yes  If so, are there any without handrails? No  Home free of loose throw rugs in walkways, pet beds, electrical cords, etc? Yes  Adequate lighting in your home to reduce risk of falls? Yes   ASSISTIVE DEVICES UTILIZED TO PREVENT FALLS:  Life alert? No  Use of a cane, walker or w/c? No  Grab bars in the bathroom? Yes  Shower chair or bench in shower? Yes  Elevated toilet seat or a handicapped toilet? No   TIMED UP AND GO:  Was the test performed? No .   Cognitive Function:        07/24/2021    8:58 AM 04/16/2019  9:34 AM  6CIT Screen  What Year? 0 points 0 points  What month? 0 points 0 points  What time? 0 points 0 points  Count back from 20 0 points 0 points  Months in reverse 0 points 0 points  Repeat phrase 0 points 0 points  Total Score 0 points 0 points    Immunizations Immunization History   Administered Date(s) Administered   Fluad Quad(high Dose 65+) 12/12/2018, 01/16/2020   Influenza, High Dose Seasonal PF 02/27/2018   Influenza-Unspecified 01/11/2021   PFIZER(Purple Top)SARS-COV-2 Vaccination 03/15/2019, 04/05/2019, 11/29/2019, 01/31/2021   Pneumococcal Conjugate-13 11/10/2016   Pneumococcal Polysaccharide-23 11/10/2015   Tdap 04/26/2016   Zoster, Live 07/29/2010    TDAP status: Up to date  Flu Vaccine status: Up to date  Pneumococcal vaccine status: Up to date  Covid-19 vaccine status: Completed vaccines  Qualifies for Shingles Vaccine? Yes   Zostavax completed No   Shingrix Completed?: No.    Education has been provided regarding the importance of this vaccine. Patient has been advised to call insurance company to determine out of pocket expense if they have not yet received this vaccine. Advised may also receive vaccine at local pharmacy or Health Dept. Verbalized acceptance and understanding.  Screening Tests Health Maintenance  Topic Date Due   Hepatitis C Screening  Never done   Zoster Vaccines- Shingrix (1 of 2) Never done   COVID-19 Vaccine (5 - Booster for Pfizer series) 03/28/2021   INFLUENZA VACCINE  09/22/2021   TETANUS/TDAP  04/27/2026   COLONOSCOPY (Pts 45-39yr Insurance coverage will need to be confirmed)  03/05/2027   Pneumonia Vaccine 72 Years old  Completed   HPV VACCINES  Aged Out    Health Maintenance  Health Maintenance Due  Topic Date Due   Hepatitis C Screening  Never done   Zoster Vaccines- Shingrix (1 of 2) Never done   COVID-19 Vaccine (5 - Booster for Pfizer series) 03/28/2021    Colorectal cancer screening: Type of screening: Colonoscopy. Completed 03/04/17. Repeat every 10 years   Additional Screening:  Hepatitis C Screening: does qualify;  Vision Screening: Recommended annual ophthalmology exams for early detection of glaucoma and other disorders of the eye. Is the patient up to date with their annual eye exam?   Yes  Who is the provider or what is the name of the office in which the patient attends annual eye exams? Dr hHerbert Deaner If pt is not established with a provider, would they like to be referred to a provider to establish care? No .   Dental Screening: Recommended annual dental exams for proper oral hygiene  Community Resource Referral / Chronic Care Management: CRR required this visit?  No   CCM required this visit?  No      Plan:     I have personally reviewed and noted the following in the patient's chart:   Medical and social history Use of alcohol, tobacco or illicit drugs  Current medications and supplements including opioid prescriptions. Patient is not currently taking opioid prescriptions. Functional ability and status Nutritional status Physical activity Advanced directives List of other physicians Hospitalizations, surgeries, and ER visits in previous 12 months Vitals Screenings to include cognitive, depression, and falls Referrals and appointments  In addition, I have reviewed and discussed with patient certain preventive protocols, quality metrics, and best practice recommendations. A written personalized care plan for preventive services as well as general preventive health recommendations were provided to patient.     TWillette Brace LPN  07/24/2021   Nurse Notes: None

## 2021-08-03 ENCOUNTER — Ambulatory Visit (INDEPENDENT_AMBULATORY_CARE_PROVIDER_SITE_OTHER): Payer: Medicare Other

## 2021-08-03 DIAGNOSIS — I7121 Aneurysm of the ascending aorta, without rupture: Secondary | ICD-10-CM | POA: Diagnosis not present

## 2021-08-03 LAB — ECHOCARDIOGRAM COMPLETE
AR max vel: 1.6 cm2
AV Area VTI: 2.26 cm2
AV Area mean vel: 1.89 cm2
AV Mean grad: 5 mmHg
AV Peak grad: 9.5 mmHg
AV Vena cont: 0.23 cm
Ao pk vel: 1.54 m/s
Area-P 1/2: 2.99 cm2
Calc EF: 46.2 %
S' Lateral: 3.52 cm
Single Plane A2C EF: 44.6 %
Single Plane A4C EF: 49 %

## 2021-08-06 ENCOUNTER — Ambulatory Visit (INDEPENDENT_AMBULATORY_CARE_PROVIDER_SITE_OTHER): Payer: Medicare Other | Admitting: Cardiovascular Disease

## 2021-08-06 ENCOUNTER — Encounter (HOSPITAL_BASED_OUTPATIENT_CLINIC_OR_DEPARTMENT_OTHER): Payer: Self-pay | Admitting: Cardiovascular Disease

## 2021-08-06 DIAGNOSIS — I2510 Atherosclerotic heart disease of native coronary artery without angina pectoris: Secondary | ICD-10-CM

## 2021-08-06 DIAGNOSIS — I7121 Aneurysm of the ascending aorta, without rupture: Secondary | ICD-10-CM | POA: Diagnosis not present

## 2021-08-06 DIAGNOSIS — I10 Essential (primary) hypertension: Secondary | ICD-10-CM | POA: Diagnosis not present

## 2021-08-06 NOTE — Progress Notes (Signed)
Cardiology Office Note:    Date:  08/06/2021   ID:  Nathan Collins, DOB 10/24/1950, MRN [id number]  PCP:  Vivi Barrack, MD   [facility] Providers Cardiologist:  None Cardiology APP:  Burtis Junes, NP (Inactive)     Referring MD: Vivi Barrack, MD   No chief complaint on file. CC: Follow up  History of Present Illness:    Nathan Collins is a 71 y.o. male with a hx of arthritis, malignant melanoma, CAD s/p CT coronary/FFR (02/20/2018), hypertension, obstructive sleep apnea with CPAP, gout, hyperlipidemia,and thoracic ascending aortic aneurysm 4 cm (02/20/2018) here for follow up. He was last seen by Truitt Merle, NP on 03/04/2020. He initially saw her for evaluation of CAD risk factors. Prior coronary CTA 01/2018 revealed a calcium score of 137 which was 53rd percenticle and an ascending aortic aneurysm. FFR was mildly abnormal and distal OM1. He had a follow up Chest CT 02/2020 which showed his aneurysm was stable.  At his last appointment he was doing well we discussed increasing his exercise and referred him to the PREP program.  Today, He states that he has been doing well, but currently has only been walking a mile a week. He reports that his weight has been fluctuating between 112 lbs and now he weighs 116 lbs.  He states he has pain in his right shoulder. His PA told him that it may be bursitis, but he does not believe this is the case. The onset of his pain was in the left shoulder joint, but he feels pain in his entire shoulder. The shoulder pain is intermittent, and he notes that there is some weakness in it.  He is not interested in injections or any medications for it.  The patient denies chest pain, shortness of breath, nocturnal dyspnea, orthopnea or peripheral edema.  There have been no palpitations, lightheadedness or syncope.  Complains of shoulder pain.   Past Medical History:  Diagnosis Date   Arthritis    Ascending aortic aneurysm ([place]) 08/05/2020   Cancer  ([place])    HX OF MALIGNANT MELANOMA SHOULDER    Coronary artery disease    Medical thearpy, 02/20/18 CT coronary/FFR   Heart murmur    History of kidney stones     "A FEW YRS AGO"   Hypertension    Sleep apnea    CPAP- 4 GOES TO 12 OR 14    Thoracic ascending aortic aneurysm (HCC)    4 cm 02/20/18    Past Surgical History:  Procedure Laterality Date   APPENDECTOMY     JOINT REPLACEMENT     RIGHT KNEE REPLACEMENT    KNEE ARTHROSCOPY     LEFT    LUMBAR LAMINECTOMY/DECOMPRESSION MICRODISCECTOMY Bilateral 03/23/2018   Procedure: Lumbar Three-Four Lumbar Four-Five Laminectomy/Foraminotomy with sublaminar decompression;  Surgeon: [name], MD;  Location: [place];  Service: Neurosurgery;  Laterality: Bilateral;  Lumbar Three-Four Lumbar Four-Five Laminectomy/Foraminotomy with sublaminar decompression   RIGHT KNEE SURGERY      TOTAL KNEE ARTHROPLASTY  12/10/2011   Procedure: TOTAL KNEE ARTHROPLASTY;  Surgeon: [name], MD;  Location: [facility];  Service: Orthopedics;  Laterality: Left;   VASECTOMY      Current Medications: No outpatient medications have been marked as taking for the 08/06/21 encounter (Office Visit) with Skeet Latch, MD.     Allergies:   Indocin [indomethacin]   Social History   Socioeconomic History   Marital status: Married    Spouse name: Not  on file   Number of children: Not on file   Years of education: Not on file   Highest education level: Not on file  Occupational History    Comment: Sales Rep   Tobacco Use   Smoking status: Never   Smokeless tobacco: Never  Vaping Use   Vaping Use: Never used  Substance and Sexual Activity   Alcohol use: Not Currently   Drug use: No   Sexual activity: Yes    Partners: Female  Other Topics Concern   Not on file  Social History Narrative   Not on file   Social Determinants of Health   Financial Resource Strain: Low Risk  (07/24/2021)   Overall Financial Resource Strain (CARDIA)    Difficulty of  Paying Living Expenses: Not hard at all  Food Insecurity: No Food Insecurity (07/24/2021)   Hunger Vital Sign    Worried About Running Out of Food in the Last Year: Never true    Ran Out of Food in the Last Year: Never true  Transportation Needs: No Transportation Needs (07/24/2021)   PRAPARE - Hydrologist (Medical): No    Lack of Transportation (Non-Medical): No  Physical Activity: Insufficiently Active (07/24/2021)   Exercise Vital Sign    Days of Exercise per Week: 3 days    Minutes of Exercise per Session: 30 min  Stress: No Stress Concern Present (07/24/2021)   [place]    Feeling of Stress : Not at all  Social Connections: Moderately Integrated (07/24/2021)   Social Connection and Isolation Panel [NHANES]    Frequency of Communication with Friends and Family: More than three times a week    Frequency of Social Gatherings with Friends and Family: More than three times a week    Attends Religious Services: More than 4 times per year    Active Member of [employer] or Organizations: No    Attends Music therapist: Never    Marital Status: Married     Family History: The patient's family history includes Arrhythmia in his mother; Bladder Cancer in his mother; Diabetes in his father; Heart failure in his father and mother; Hyperlipidemia in his mother; Peripheral Artery Disease in his mother; Prostate cancer in his father.  ROS:   Please see the history of present illness.   (+) Shoulder/joint angina  All other systems reviewed and are negative.  EKGs/Labs/Other Studies Reviewed:    The following studies were reviewed today:  Echo 07/2021: LVEF, mild LVH assendtion stable 4.7 centimeters.   CT Angio Chest 03/24/2020: 1. Stable uncomplicated mild fusiform aneurysmal dilatation of the ascending thoracic aorta measuring 40 mm in diameter, grossly unchanged compared to the 01/2018  examination. Recommend annual imaging followup by CTA or MRA. This recommendation follows 2010 ACCF/AHA/AATS/ACR/ASA/SCA/SCAI/SIR/STS/SVM Guidelines for the Diagnosis and Management of Patients with Thoracic Aortic Disease. Circulation. 2010; 121: T016-W109. Aortic aneurysm NOS (ICD10-I71.9) 2. Coronary artery calcifications. 3. Punctate (sub 5 mm) bilateral pulmonary nodules are unchanged compared to the 01/2018 examination. Continued attention on above recommended follow-up CTA would ensure greater than to the stability and thus a benign etiology. 4. Suspected hepatic steatosis. Correlation with LFTs is advised. 5. Aortic Atherosclerosis (ICD10-I70.0).  FFR CT 02/20/2018: FFR CT is abnormal in the most distal aspect of OM1 and distal left sided PDA. Would consider medical Rx initially as abnormalities only in the most distal aspect of small vessels.  CT Coronary Morph 02/20/2018: 1.  Calcium  score 137 which is 53 rd percentile for age and sex   2.  Mild aortic root dilatation 4.0 cm   3. CAD worst lesion in mid LAD prior to D2 take off 50% Study will be sent for FFR CT  Echo 01/17/2018: - Left ventricle: The cavity size was normal. There was mild    concentric hypertrophy. Systolic function was normal. The    estimated ejection fraction was in the range of 60% to 65%. Wall    motion was normal; there were no regional wall motion    abnormalities. Doppler parameters are consistent with abnormal    left ventricular relaxation (grade 1 diastolic dysfunction).    Doppler parameters are consistent with indeterminate ventricular    filling pressure.  - Aortic valve: Transvalvular velocity was within the normal range.    There was no stenosis. There was no regurgitation.  - Aorta: Ascending aortic diameter: 41 mm (S).  - Ascending aorta: The ascending aorta was mildly dilated.  - Mitral valve: Transvalvular velocity was within the normal range.    There was no evidence for  stenosis. There was no regurgitation.  - Left atrium: The atrium was mildly dilated.  - Right ventricle: The cavity size was normal. Wall thickness was    normal. Systolic function was normal.  - Pulmonary arteries: Systolic pressure was within the normal    range.  - Global longitudinal strain -19.3% (normal).  EKG:   08/06/2021 EKG: Rate 73. Sinus Rhythm . 08/05/2020: EKG is not ordered today.   Recent Labs: 04/06/2021: ALT 29; BUN 25; Creatinine, Ser 1.30; Hemoglobin 14.5; Platelets 156.0; Potassium 4.6; Sodium 144; TSH 2.48  Recent Lipid Panel    Component Value Date/Time   CHOL 132 04/06/2021 0851   CHOL 130 09/24/2019 0958   TRIG 86.0 04/06/2021 0851   HDL 42.80 04/06/2021 0851   HDL 40 09/24/2019 0958   CHOLHDL 3 04/06/2021 0851   VLDL 17.2 04/06/2021 0851   LDLCALC 72 04/06/2021 0851   LDLCALC 70 09/24/2019 0958     Physical Exam:    VS:  BP 126/76 (BP Location: Right Arm, Patient Position: Sitting, Cuff Size: Normal)   Pulse 73   Ht '5\' 7"'$  (1.702 m)   Wt 217 lb 14.4 oz (98.8 kg)   BMI 34.13 kg/m  , BMI Body mass index is 34.13 kg/m. GENERAL:  Well appearing HEENT: Pupils equal round and reactive, fundi not visualized, oral mucosa unremarkable NECK:  No jugular venous distention, waveform within normal limits, carotid upstroke brisk and symmetric, no bruits, no thyromegaly LUNGS:  Clear to auscultation bilaterally HEART:  RRR.  PMI not displaced or sustained,S1 and S2 within normal limits, no S3, no S4, no clicks, no rubs, no murmurs ABD:  Flat, positive bowel sounds normal in frequency in pitch, no bruits, no rebound, no guarding, no midline pulsatile mass, no hepatomegaly, no splenomegaly EXT:  2 plus pulses throughout, no edema, no cyanosis no clubbing SKIN:  No rashes no nodules NEURO:  Cranial nerves II through XII grossly intact, motor grossly intact throughout PSYCH:  Cognitively intact, oriented to person place and time    ASSESSMENT:    1. Coronary  artery disease involving native coronary artery of native heart without angina pectoris   2. Aneurysm of ascending aorta without rupture ([place])   3. Essential hypertension     PLAN:   CAD (coronary artery disease) He has no anginal symptoms.  Encouraged him to increase his exercise.  Continue aspirin, atorvastatin,  and Zetia.  LDL is 72.  He is working on his diet and increasing his exercise.  Continue meds at current dose for now.  Ascending aortic aneurysm (HCC) Ascending aorta was 4.0 cm.  Essentially unchanged since 2019.  We will not repeat his echo next year but will repeat in 2025.  Blood pressure well controlled.  He will check a little bit more at home to make sure.  Essential hypertension Blood pressures controlled today.  His goal is less than 130/80.  Should he need any additional medications in the future would add a beta-blocker given his ascending aortic aneurysm.  He is doing well on valsartan.  He is going to work on increasing his exercise as above.      Disposition: Follow-up with Eulamae Greenstein C. Oval Linsey, MD, [facility] in 1 year.   Medication Adjustments/Labs and Tests Ordered: Current medicines are reviewed at length with the patient today.  Concerns regarding medicines are outlined above.  Orders Placed This Encounter  Procedures   EKG 12-Lead    No orders of the defined types were placed in this encounter.   Patient Instructions  Medication Instructions:  Your physician recommends that you continue on your current medications as directed. Please refer to the Current Medication list given to you today.  *If you need a refill on your cardiac medications before your next appointment, please call your pharmacy*  Lab Work: NONE  Testing/Procedures: NONE  Follow-Up: At [facility], you and your health needs are our priority.  As part of our continuing mission to provide you with exceptional heart care, we have created designated Provider Care Teams.  These Care  Teams include your primary Cardiologist (physician) and Advanced Practice Providers (APPs -  Physician Assistants and Nurse Practitioners) who all work together to provide you with the care you need, when you need it.  We recommend signing up for the patient portal called "MyChart".  Sign up information is provided on this After Visit Summary.  MyChart is used to connect with patients for Virtual Visits (Telemedicine).  Patients are able to view lab/test results, encounter notes, upcoming appointments, etc.  Non-urgent messages can be sent to your provider as well.   To learn more about what you can do with MyChart, go to [email or web address].    Your next appointment:   12 month(s)  The format for your next appointment:   In Person  Provider:   Skeet Latch, MD{  Other Instructions Exercise recommendations: The American Heart Association recommends 150 minutes of moderate intensity exercise weekly. Try 30 minutes of moderate intensity exercise 4-5 times per week. This could include walking, jogging, or swimming.          I,Tinashe Williams,acting as a Education administrator for [place], MD.,have documented all relevant documentation on the behalf of Skeet Latch, MD,as directed by  Skeet Latch, MD while in the presence of Skeet Latch, MD.   I, [place] Oval Linsey, MD have reviewed all documentation for this visit.  The documentation of the exam, diagnosis, procedures, and orders on 08/06/2021 are all accurate and complete.

## 2021-08-06 NOTE — Assessment & Plan Note (Signed)
Ascending aorta was 4.0 cm.  Essentially unchanged since 2019.  We will not repeat his echo next year but will repeat in 2025.  Blood pressure well controlled.  He will check a little bit more at home to make sure.

## 2021-08-06 NOTE — Patient Instructions (Signed)
Medication Instructions:  Your physician recommends that you continue on your current medications as directed. Please refer to the Current Medication list given to you today.  *If you need a refill on your cardiac medications before your next appointment, please call your pharmacy*  Lab Work: NONE  Testing/Procedures: NONE  Follow-Up: At [facility], you and your health needs are our priority.  As part of our continuing mission to provide you with exceptional heart care, we have created designated Provider Care Teams.  These Care Teams include your primary Cardiologist (physician) and Advanced Practice Providers (APPs -  Physician Assistants and Nurse Practitioners) who all work together to provide you with the care you need, when you need it.  We recommend signing up for the patient portal called "MyChart".  Sign up information is provided on this After Visit Summary.  MyChart is used to connect with patients for Virtual Visits (Telemedicine).  Patients are able to view lab/test results, encounter notes, upcoming appointments, etc.  Non-urgent messages can be sent to your provider as well.   To learn more about what you can do with MyChart, go to [email or web address].    Your next appointment:   12 month(s)  The format for your next appointment:   In Person  Provider:   Skeet Latch, MD{  Other Instructions Exercise recommendations: The American Heart Association recommends 150 minutes of moderate intensity exercise weekly. Try 30 minutes of moderate intensity exercise 4-5 times per week. This could include walking, jogging, or swimming.

## 2021-08-06 NOTE — Assessment & Plan Note (Signed)
Blood pressures controlled today.  His goal is less than 130/80.  Should he need any additional medications in the future would add a beta-blocker given his ascending aortic aneurysm.  He is doing well on valsartan.  He is going to work on increasing his exercise as above.

## 2021-08-06 NOTE — Assessment & Plan Note (Signed)
He has no anginal symptoms.  Encouraged him to increase his exercise.  Continue aspirin, atorvastatin, and Zetia.  LDL is 72.  He is working on his diet and increasing his exercise.  Continue meds at current dose for now.

## 2021-09-17 DIAGNOSIS — D123 Benign neoplasm of transverse colon: Secondary | ICD-10-CM | POA: Diagnosis not present

## 2021-09-17 DIAGNOSIS — D122 Benign neoplasm of ascending colon: Secondary | ICD-10-CM | POA: Diagnosis not present

## 2021-09-17 DIAGNOSIS — Z09 Encounter for follow-up examination after completed treatment for conditions other than malignant neoplasm: Secondary | ICD-10-CM | POA: Diagnosis not present

## 2021-09-17 DIAGNOSIS — D125 Benign neoplasm of sigmoid colon: Secondary | ICD-10-CM | POA: Diagnosis not present

## 2021-09-17 DIAGNOSIS — D124 Benign neoplasm of descending colon: Secondary | ICD-10-CM | POA: Diagnosis not present

## 2021-09-17 DIAGNOSIS — D126 Benign neoplasm of colon, unspecified: Secondary | ICD-10-CM | POA: Diagnosis not present

## 2021-09-17 DIAGNOSIS — K5730 Diverticulosis of large intestine without perforation or abscess without bleeding: Secondary | ICD-10-CM | POA: Diagnosis not present

## 2021-09-17 DIAGNOSIS — Z86010 Personal history of colonic polyps: Secondary | ICD-10-CM | POA: Diagnosis not present

## 2021-09-21 DIAGNOSIS — D125 Benign neoplasm of sigmoid colon: Secondary | ICD-10-CM | POA: Diagnosis not present

## 2021-09-21 DIAGNOSIS — D122 Benign neoplasm of ascending colon: Secondary | ICD-10-CM | POA: Diagnosis not present

## 2021-09-21 DIAGNOSIS — D124 Benign neoplasm of descending colon: Secondary | ICD-10-CM | POA: Diagnosis not present

## 2021-09-21 DIAGNOSIS — D123 Benign neoplasm of transverse colon: Secondary | ICD-10-CM | POA: Diagnosis not present

## 2021-09-22 ENCOUNTER — Encounter: Payer: Self-pay | Admitting: Family Medicine

## 2021-09-22 ENCOUNTER — Other Ambulatory Visit: Payer: Self-pay | Admitting: Family Medicine

## 2021-09-22 MED ORDER — SILDENAFIL CITRATE 20 MG PO TABS: 100.0000 mg | ORAL_TABLET | Freq: Every day | ORAL | 0 refills | Status: DC | PRN

## 2021-09-22 MED ORDER — AMOXICILLIN 500 MG PO CAPS: 2000.0000 mg | ORAL_CAPSULE | ORAL | 0 refills | Status: AC | PRN

## 2021-09-30 ENCOUNTER — Other Ambulatory Visit: Payer: Self-pay | Admitting: Family Medicine

## 2021-09-30 ENCOUNTER — Other Ambulatory Visit: Payer: Self-pay | Admitting: Cardiovascular Disease

## 2021-09-30 MED ORDER — ATORVASTATIN CALCIUM 20 MG PO TABS: 20.0000 mg | ORAL_TABLET | Freq: Every day | ORAL | 3 refills | Status: DC

## 2021-09-30 MED ORDER — VALSARTAN 160 MG PO TABS: 160.0000 mg | ORAL_TABLET | Freq: Every day | ORAL | 3 refills | Status: DC

## 2021-09-30 MED ORDER — ALLOPURINOL 100 MG PO TABS: 200.0000 mg | ORAL_TABLET | Freq: Every morning | ORAL | 0 refills | Status: DC

## 2021-09-30 NOTE — Telephone Encounter (Signed)
Sent prescription to pharmacy . Sent message to patient

## 2021-10-20 DIAGNOSIS — U071 COVID-19: Secondary | ICD-10-CM | POA: Diagnosis not present

## 2021-11-16 ENCOUNTER — Encounter: Payer: Self-pay | Admitting: *Deleted

## 2021-12-31 ENCOUNTER — Other Ambulatory Visit: Payer: Self-pay | Admitting: Cardiovascular Disease

## 2021-12-31 ENCOUNTER — Other Ambulatory Visit (HOSPITAL_BASED_OUTPATIENT_CLINIC_OR_DEPARTMENT_OTHER): Payer: Self-pay | Admitting: Family

## 2021-12-31 NOTE — Telephone Encounter (Signed)
Rx request sent to pharmacy.  

## 2022-02-01 ENCOUNTER — Other Ambulatory Visit: Payer: Self-pay | Admitting: Family Medicine

## 2022-02-04 ENCOUNTER — Encounter: Payer: Self-pay | Admitting: *Deleted

## 2022-02-10 DIAGNOSIS — Z23 Encounter for immunization: Secondary | ICD-10-CM | POA: Diagnosis not present

## 2022-04-06 ENCOUNTER — Encounter: Payer: Self-pay | Admitting: Family Medicine

## 2022-04-06 ENCOUNTER — Ambulatory Visit (INDEPENDENT_AMBULATORY_CARE_PROVIDER_SITE_OTHER): Payer: Medicare Other | Admitting: Family Medicine

## 2022-04-06 VITALS — BP 131/74 | HR 75 | Temp 98.2°F | Ht 67.0 in | Wt 217.0 lb

## 2022-04-06 DIAGNOSIS — R351 Nocturia: Secondary | ICD-10-CM

## 2022-04-06 DIAGNOSIS — R739 Hyperglycemia, unspecified: Secondary | ICD-10-CM

## 2022-04-06 DIAGNOSIS — E785 Hyperlipidemia, unspecified: Secondary | ICD-10-CM

## 2022-04-06 DIAGNOSIS — Z8042 Family history of malignant neoplasm of prostate: Secondary | ICD-10-CM

## 2022-04-06 DIAGNOSIS — I10 Essential (primary) hypertension: Secondary | ICD-10-CM | POA: Diagnosis not present

## 2022-04-06 DIAGNOSIS — M109 Gout, unspecified: Secondary | ICD-10-CM

## 2022-04-06 LAB — CBC
HCT: 42.5 % (ref 39.0–52.0)
Hemoglobin: 14.1 g/dL (ref 13.0–17.0)
MCHC: 33.3 g/dL (ref 30.0–36.0)
MCV: 88.5 fl (ref 78.0–100.0)
Platelets: 199 10*3/uL (ref 150.0–400.0)
RBC: 4.8 Mil/uL (ref 4.22–5.81)
RDW: 14.5 % (ref 11.5–15.5)
WBC: 6.9 10*3/uL (ref 4.0–10.5)

## 2022-04-06 LAB — COMPREHENSIVE METABOLIC PANEL
ALT: 22 U/L (ref 0–53)
AST: 23 U/L (ref 0–37)
Albumin: 4.2 g/dL (ref 3.5–5.2)
Alkaline Phosphatase: 82 U/L (ref 39–117)
BUN: 29 mg/dL — ABNORMAL HIGH (ref 6–23)
CO2: 28 mEq/L (ref 19–32)
Calcium: 9.1 mg/dL (ref 8.4–10.5)
Chloride: 108 mEq/L (ref 96–112)
Creatinine, Ser: 1.32 mg/dL (ref 0.40–1.50)
GFR: 54.03 mL/min — ABNORMAL LOW (ref >60.00)
Glucose, Bld: 109 mg/dL — ABNORMAL HIGH (ref 70–99)
Potassium: 4.6 mEq/L (ref 3.5–5.1)
Sodium: 143 mEq/L (ref 135–145)
Total Bilirubin: 0.6 mg/dL (ref 0.2–1.2)
Total Protein: 6.1 g/dL (ref 6.0–8.3)

## 2022-04-06 LAB — PSA: PSA: 1.91 ng/mL (ref 0.10–4.00)

## 2022-04-06 LAB — LIPID PANEL
Cholesterol: 130 mg/dL (ref 0–200)
HDL: 42.6 mg/dL (ref >39.00)
LDL Cholesterol: 75 mg/dL (ref 0–99)
NonHDL: 87.15
Total CHOL/HDL Ratio: 3
Triglycerides: 63 mg/dL (ref 0.0–149.0)
VLDL: 12.6 mg/dL (ref 0.0–40.0)

## 2022-04-06 LAB — URIC ACID: Uric Acid, Serum: 6.7 mg/dL (ref 4.0–7.8)

## 2022-04-06 LAB — TSH: TSH: 1.6 u[IU]/mL (ref 0.35–5.50)

## 2022-04-06 LAB — HEMOGLOBIN A1C: Hgb A1c MFr Bld: 6 % (ref 4.6–6.5)

## 2022-04-06 NOTE — Assessment & Plan Note (Deleted)
6 

## 2022-04-06 NOTE — Assessment & Plan Note (Signed)
Check A1c. 

## 2022-04-06 NOTE — Patient Instructions (Signed)
It was very nice to see you today!  We we will check blood work today.  Please let me know if you would like for [place] to take off your toenail.  We will see you back in a year for your next physical.  Please come back to see [place] sooner if needed.  Take care, Dr Jerline Pain  PLEASE NOTE:  If you had any lab tests, please let us know if you have not heard back within a few days. You may see your results on mychart before we have a chance to review them but we will give you a call once they are reviewed by [place].   If we ordered any referrals today, please let us know if you have not heard from their office within the next week.   If you had any urgent prescriptions sent in today, please check with the pharmacy within an hour of our visit to make sure the prescription was transmitted appropriately.   Please try these tips to maintain a healthy lifestyle:  Eat at least 3 REAL meals and 1-2 snacks per day.  Aim for no more than 5 hours between eating.  If you eat breakfast, please do so within one hour of getting up.   Each meal should contain half fruits/vegetables, one quarter protein, and one quarter carbs (no bigger than a computer mouse)  Cut down on sweet beverages. This includes juice, soda, and sweet tea.   Drink at least 1 glass of water with each meal and aim for at least 8 glasses per day  Exercise at least 150 minutes every week.     Preventive Care 27 Years and Older, Male Preventive care refers to lifestyle choices and visits with your health care provider that can promote health and wellness. Preventive care visits are also called wellness exams. What can I expect for my preventive care visit? Counseling During your preventive care visit, your health care provider may ask about your: Medical history, including: Past medical problems. Family medical history. History of falls. Current health, including: Emotional well-being. Home life and relationship well-being. Sexual  activity. Memory and ability to understand (cognition). Lifestyle, including: Alcohol, nicotine or tobacco, and drug use. Access to firearms. Diet, exercise, and sleep habits. Work and work Statistician. Sunscreen use. Safety issues such as seatbelt and bike helmet use. Physical exam Your health care provider will check your: Height and weight. These may be used to calculate your BMI (body mass index). BMI is a measurement that tells if you are at a healthy weight. Waist circumference. This measures the distance around your waistline. This measurement also tells if you are at a healthy weight and may help predict your risk of certain diseases, such as type 2 diabetes and high blood pressure. Heart rate and blood pressure. Body temperature. Skin for abnormal spots. What immunizations do I need?  Vaccines are usually given at various ages, according to a schedule. Your health care provider will recommend vaccines for you based on your age, medical history, and lifestyle or other factors, such as travel or where you work. What tests do I need? Screening Your health care provider may recommend screening tests for certain conditions. This may include: Lipid and cholesterol levels. Diabetes screening. This is done by checking your blood sugar (glucose) after you have not eaten for a while (fasting). Hepatitis C test. Hepatitis B test. HIV (human immunodeficiency virus) test. STI (sexually transmitted infection) testing, if you are at risk. Lung cancer screening. Colorectal cancer screening.  Prostate cancer screening. Abdominal aortic aneurysm (AAA) screening. You may need this if you are a current or former smoker. Talk with your health care provider about your test results, treatment options, and if necessary, the need for more tests. Follow these instructions at home: Eating and drinking  Eat a diet that includes fresh fruits and vegetables, whole grains, lean protein, and low-fat  dairy products. Limit your intake of foods with high amounts of sugar, saturated fats, and salt. Take vitamin and mineral supplements as recommended by your health care provider. Do not drink alcohol if your health care provider tells you not to drink. If you drink alcohol: Limit how much you have to 0-2 drinks a day. Know how much alcohol is in your drink. In the [place] one drink equals one 12 oz bottle of beer (355 mL), one 5 oz glass of wine (148 mL), or one 1 oz glass of hard liquor (44 mL). Lifestyle Brush your teeth every morning and night with fluoride toothpaste. Floss one time each day. Exercise for at least 30 minutes 5 or more days each week. Do not use any products that contain nicotine or tobacco. These products include cigarettes, chewing tobacco, and vaping devices, such as e-cigarettes. If you need help quitting, ask your health care provider. Do not use drugs. If you are sexually active, practice safe sex. Use a condom or other form of protection to prevent STIs. Take aspirin only as told by your health care provider. Make sure that you understand how much to take and what form to take. Work with your health care provider to find out whether it is safe and beneficial for you to take aspirin daily. Ask your health care provider if you need to take a cholesterol-lowering medicine (statin). Find healthy ways to manage stress, such as: Meditation, yoga, or listening to music. Journaling. Talking to a trusted person. Spending time with friends and family. Safety Always wear your seat belt while driving or riding in a vehicle. Do not drive: If you have been drinking alcohol. Do not ride with someone who has been drinking. When you are tired or distracted. While texting. If you have been using any mind-altering substances or drugs. Wear a helmet and other protective equipment during sports activities. If you have firearms in your house, make sure you follow all gun safety  procedures. Minimize exposure to UV radiation to reduce your risk of skin cancer. What's next? Visit your health care provider once a year for an annual wellness visit. Ask your health care provider how often you should have your eyes and teeth checked. Stay up to date on all vaccines. This information is not intended to replace advice given to you by your health care provider. Make sure you discuss any questions you have with your health care provider. Document Revised: 08/06/2020 Document Reviewed: 08/06/2020 Elsevier Patient Education  [place].

## 2022-04-06 NOTE — Assessment & Plan Note (Signed)
Check lipids.  Continue Lipitor 20 mg daily, Zetia 10 mg daily.

## 2022-04-06 NOTE — Assessment & Plan Note (Signed)
BP at goal on valsartan 160 mg daily.

## 2022-04-06 NOTE — Assessment & Plan Note (Addendum)
No recent flares.  Continue allopurinol 200 mg daily.

## 2022-04-06 NOTE — Progress Notes (Signed)
   Nathan Collins is a 72 y.o. male who presents today for an office visit.  Assessment/Plan:  New/Acute Problems: Ingrown Toenail No red flags or signs of infection. We discussed conservative measures including avoiding trimming the edges too short.  We also discussed elective nail avulsion.  He will let us know if he would like for [place] to do this or if you would like to see a podiatrist for this.  Chronic Problems Addressed Today: Essential hypertension BP at goal on valsartan 160 mg daily.  Family history of prostate cancer Check PSA.  Dyslipidemia Check lipids.  Continue Lipitor 20 mg daily, Zetia 10 mg daily.  Hyperglycemia Check A1c.   Gout No recent flares.  Continue allopurinol 200 mg daily.     Subjective:  HPI:  See A/P for status of chronic conditions.  Patient is here today for annual follow-up.  He has no acute concerns.  He has been having some toenail pain on right great toenail. Worse with walking.        Objective:  Physical Exam: BP 131/74   Pulse 75   Temp 98.2 F (36.8 C) (Temporal)   Ht '5\' 7"'$  (1.702 m)   Wt 217 lb (98.4 kg)   SpO2 95%   BMI 33.99 kg/m   Gen: No acute distress, resting comfortably CV: Regular rate and rhythm with no murmurs appreciated Pulm: Normal work of breathing, clear to auscultation bilaterally with no crackles, wheezes, or rhonchi MSK: Right great toenail ingrown on bilateral edges. No erythema. No draiaing.  Neuro: Grossly normal, moves all extremities Psych: Normal affect and thought content      Jamee Keach M. Jerline Pain, MD 04/06/2022 9:10 AM

## 2022-04-06 NOTE — Assessment & Plan Note (Signed)
Check PSA. ?

## 2022-04-09 NOTE — Progress Notes (Signed)
Please inform patient of the following:  He does have some age-related kidney changes but this has been stable over the last few years.  Do not need to do any further testing at this point however he should make sure that it he is getting plenty of fluids.   His A1c is borderline elevated but stable compared to previous values.  The rest of his labs are all normal.  Do not need to make any changes to his treatment plan at this time.  He should continue to work on diet and exercise and we can recheck everything in a year.

## 2022-04-30 ENCOUNTER — Other Ambulatory Visit: Payer: Self-pay | Admitting: Family Medicine

## 2022-06-17 DIAGNOSIS — I10 Essential (primary) hypertension: Secondary | ICD-10-CM | POA: Diagnosis not present

## 2022-06-17 DIAGNOSIS — G4733 Obstructive sleep apnea (adult) (pediatric): Secondary | ICD-10-CM | POA: Diagnosis not present

## 2022-06-30 DIAGNOSIS — L821 Other seborrheic keratosis: Secondary | ICD-10-CM | POA: Diagnosis not present

## 2022-06-30 DIAGNOSIS — L570 Actinic keratosis: Secondary | ICD-10-CM | POA: Diagnosis not present

## 2022-06-30 DIAGNOSIS — L814 Other melanin hyperpigmentation: Secondary | ICD-10-CM | POA: Diagnosis not present

## 2022-06-30 DIAGNOSIS — L218 Other seborrheic dermatitis: Secondary | ICD-10-CM | POA: Diagnosis not present

## 2022-06-30 DIAGNOSIS — L988 Other specified disorders of the skin and subcutaneous tissue: Secondary | ICD-10-CM | POA: Diagnosis not present

## 2022-06-30 DIAGNOSIS — L02222 Furuncle of back [any part, except buttock]: Secondary | ICD-10-CM | POA: Diagnosis not present

## 2022-06-30 DIAGNOSIS — L718 Other rosacea: Secondary | ICD-10-CM | POA: Diagnosis not present

## 2022-06-30 DIAGNOSIS — D225 Melanocytic nevi of trunk: Secondary | ICD-10-CM | POA: Diagnosis not present

## 2022-07-08 ENCOUNTER — Telehealth: Payer: Self-pay | Admitting: Family Medicine

## 2022-07-08 NOTE — Telephone Encounter (Signed)
Contacted Nathan Collins to schedule their annual wellness visit. Appointment made for 07/27/2022.  Gabriel Cirri [facility] AWV TEAM Direct Dial [phone]

## 2022-07-27 ENCOUNTER — Ambulatory Visit (INDEPENDENT_AMBULATORY_CARE_PROVIDER_SITE_OTHER): Payer: Medicare Other

## 2022-07-27 VITALS — Wt 217.0 lb

## 2022-07-27 DIAGNOSIS — Z0000 Encounter for general adult medical examination without abnormal findings: Secondary | ICD-10-CM

## 2022-07-27 NOTE — Progress Notes (Signed)
I connected with  Nathan Collins on 07/27/22 by a audio enabled telemedicine application and verified that I am speaking with the correct person using two identifiers.  Patient Location: Home  Provider Location: Office/Clinic  I discussed the limitations of evaluation and management by telemedicine. The patient expressed understanding and agreed to proceed.   Subjective:   Nathan Collins is a 72 y.o. male who presents for Medicare Annual/Subsequent preventive examination.  Review of Systems     Cardiac Risk Factors include: advanced age (>85men, >14 women);dyslipidemia;male gender;obesity (BMI >30kg/m2);hypertension     Objective:    Today's Vitals   07/27/22 0850  Weight: 217 lb (98.4 kg)   Body mass index is 33.99 kg/m.     07/27/2022    8:54 AM 07/24/2021    8:56 AM 04/16/2019    9:33 AM 03/16/2018    9:18 AM 12/10/2011    4:30 PM 12/07/2011    9:37 AM  Advanced Directives  Does Patient Have a Medical Advance Directive? Yes Yes Yes Yes Patient has advance directive, copy not in chart Patient has advance directive, copy not in chart  Type of Advance Directive Healthcare Power of [place];Living will Healthcare Power of Attorney Living will;Healthcare Power of [employer] Power of [place];Living will Healthcare Power of [place];Living will  Does patient want to make changes to medical advance directive?   No - Patient declined     Copy of Healthcare Power of Attorney in Chart? No - copy requested No - copy requested No - copy requested     Pre-existing out of facility DNR order (yellow form or pink MOST form)     No No    Current Medications (verified) Outpatient Encounter Medications as of 07/27/2022  Medication Sig   allopurinol (ZYLOPRIM) 100 MG tablet TAKE TWO TABLETS BY MOUTH IN THE MORNING   amoxicillin (AMOXIL) 500 MG capsule Take 4 capsules (2,000 mg total) by mouth as needed (prior to dental procedures).   atorvastatin (LIPITOR) 20 MG tablet TAKE ONE  TABLET BY MOUTH ONE TIME DAILY   Coenzyme Q10 (CO Q 10 PO) Take 300 mg by mouth daily.   COLCRYS 0.6 MG tablet TAKE 1 TABLET (0.6 MG TOTAL) BY MOUTH DAILY AS NEEDED (GOUT).   ezetimibe (ZETIA) 10 MG tablet TAKE ONE TABLET BY MOUTH ONE TIME DAILY   Multiple Vitamin (MULTIVITAMIN) tablet Take 1 tablet by mouth daily.   sildenafil (REVATIO) 20 MG tablet Take 5 tablets (100 mg total) by mouth daily as needed (ED).   valsartan (DIOVAN) 160 MG tablet Take 1 tablet (160 mg total) by mouth daily.   No facility-administered encounter medications on file as of 07/27/2022.    Allergies (verified) Indocin [indomethacin]   History: Past Medical History:  Diagnosis Date   Arthritis    Ascending aortic aneurysm (HCC) 08/05/2020   Cancer (HCC)    HX OF MALIGNANT MELANOMA SHOULDER    Coronary artery disease    Medical thearpy, 02/20/18 CT coronary/FFR   Heart murmur    History of kidney stones     "A FEW YRS AGO"   Hypertension    Sleep apnea    CPAP- 4 GOES TO 12 OR 14    Thoracic ascending aortic aneurysm (HCC)    4 cm 02/20/18   Past Surgical History:  Procedure Laterality Date   APPENDECTOMY     JOINT REPLACEMENT     RIGHT KNEE REPLACEMENT    KNEE ARTHROSCOPY     LEFT  LUMBAR LAMINECTOMY/DECOMPRESSION MICRODISCECTOMY Bilateral 03/23/2018   Procedure: Lumbar Three-Four Lumbar Four-Five Laminectomy/Foraminotomy with sublaminar decompression;  Surgeon: Tia Alert, MD;  Location: [facility];  Service: Neurosurgery;  Laterality: Bilateral;  Lumbar Three-Four Lumbar Four-Five Laminectomy/Foraminotomy with sublaminar decompression   RIGHT KNEE SURGERY      TOTAL KNEE ARTHROPLASTY  12/10/2011   Procedure: TOTAL KNEE ARTHROPLASTY;  Surgeon: Eugenia Mcalpine, MD;  Location: [facility] ORS;  Service: Orthopedics;  Laterality: Left;   VASECTOMY     Family History  Problem Relation Age of Onset   Diabetes Father        deceased August 24, 2008   Prostate cancer Father        cabg    Heart failure Father     Bladder Cancer Mother        pacemaker 25-Aug-2010   Peripheral Artery Disease Mother    Arrhythmia Mother    Heart failure Mother    Hyperlipidemia Mother    Social History   Socioeconomic History   Marital status: Married    Spouse name: Not on file   Number of children: Not on file   Years of education: Not on file   Highest education level: Not on file  Occupational History    Comment: Sales Rep   Tobacco Use   Smoking status: Never   Smokeless tobacco: Never  Vaping Use   Vaping Use: Never used  Substance and Sexual Activity   Alcohol use: Not Currently   Drug use: No   Sexual activity: Yes    Partners: Female  Other Topics Concern   Not on file  Social History Narrative   Not on file   Social Determinants of Health   Financial Resource Strain: Low Risk  (07/26/2022)   Overall Financial Resource Strain (CARDIA)    Difficulty of Paying Living Expenses: Not hard at all  Food Insecurity: No Food Insecurity (07/26/2022)   Hunger Vital Sign    Worried About Running Out of Food in the Last Year: Never true    Ran Out of Food in the Last Year: Never true  Transportation Needs: No Transportation Needs (07/26/2022)   PRAPARE - Administrator, Civil Service (Medical): No    Lack of Transportation (Non-Medical): No  Physical Activity: Insufficiently Active (07/26/2022)   Exercise Vital Sign    Days of Exercise per Week: 2 days    Minutes of Exercise per Session: 20 min  Stress: No Stress Concern Present (07/26/2022)   [employer] of Occupational Health - Occupational Stress Questionnaire    Feeling of Stress : Not at all  Social Connections: Moderately Integrated (07/26/2022)   Social Connection and Isolation Panel [NHANES]    Frequency of Communication with Friends and Family: Three times a week    Frequency of Social Gatherings with Friends and Family: Patient declined    Attends Religious Services: 1 to 4 times per year    Active Member of [employer] or Organizations:  No    Attends Engineer, structural: Never    Marital Status: Married    Tobacco Counseling Counseling given: Not Answered   Clinical Intake:  Pre-visit preparation completed: Yes  Pain : No/denies pain     BMI - recorded: 33.99 Nutritional Status: BMI > 30  Obese Nutritional Risks: None Diabetes: No  How often do you need to have someone help you when you read instructions, pamphlets, or other written materials from your doctor or pharmacy?: 1 - Never  Diabetic?no  Interpreter Needed?:  No  Information entered by :: Lanier Ensign, LPN   Activities of Daily Living    07/26/2022    9:39 AM  In your present state of health, do you have any difficulty performing the following activities:  Hearing? 0  Vision? 0  Difficulty concentrating or making decisions? 0  Walking or climbing stairs? 0  Dressing or bathing? 0  Doing errands, shopping? 0  Preparing Food and eating ? N  Using the Toilet? N  In the past six months, have you accidently leaked urine? N  Do you have problems with loss of bowel control? N  Managing your Medications? N  Managing your Finances? N  Housekeeping or managing your Housekeeping? N    Patient Care Team: Ardith Dark, MD as PCP - General (Family Medicine) Dominica Severin, MD as Consulting Physician (Orthopedic Surgery) Rosalio Macadamia, NP (Inactive) as Nurse Practitioner (Cardiology) Dimitri Ped, MD as Consulting Physician (Ophthalmology) Tia Alert, MD as Consulting Physician (Neurosurgery) Deretha Emory, MD (Inactive) as Consulting Physician (Sleep Medicine)  Indicate any recent Medical Services you may have received from other than [facility] providers in the past year (date may be approximate).     Assessment:   This is a routine wellness examination for [place].  Hearing/Vision screen Hearing Screening - Comments:: Pt has hearing aids  Vision Screening - Comments:: Pt follows up with Dr Elmer Picker   Dietary  issues and exercise activities discussed: Current Exercise Habits: Home exercise routine, Type of exercise: Other - see comments, Time (Minutes): 20, Frequency (Times/Week): 2, Weekly Exercise (Minutes/Week): 40   Goals Addressed             This Visit's Progress    Patient Stated       Lose weight       Depression Screen    07/27/2022    8:53 AM 04/06/2022    8:37 AM 07/24/2021    8:55 AM 04/16/2019    9:34 AM 12/14/2018   11:28 AM 10/11/2017    8:34 AM  PHQ 2/9 Scores  PHQ - 2 Score 0 0 0 0 0 0    Fall Risk    07/26/2022    9:39 AM 04/06/2022    8:38 AM 07/24/2021    8:57 AM 04/16/2019    9:34 AM 10/11/2017    8:34 AM  Fall Risk   Falls in the past year? 0 0 0 0 No  Number falls in past yr: 0 0 0 0   Injury with Fall? 0 0 0 0   Risk for fall due to : Impaired vision No Fall Risks Impaired vision    Follow up Falls prevention discussed  Falls prevention discussed Falls evaluation completed;Education provided;Falls prevention discussed     FALL RISK PREVENTION PERTAINING TO THE HOME:  Any stairs in or around the home? Yes  If so, are there any without handrails? No  Home free of loose throw rugs in walkways, pet beds, electrical cords, etc? Yes  Adequate lighting in your home to reduce risk of falls? Yes   ASSISTIVE DEVICES UTILIZED TO PREVENT FALLS:  Life alert? No  Use of a cane, walker or w/c? No  Grab bars in the bathroom? Yes  Shower chair or bench in shower? Yes  Elevated toilet seat or a handicapped toilet? Yes   TIMED UP AND GO:  Was the test performed? No .  Cognitive Function:        07/27/2022    8:55 AM  07/24/2021    8:58 AM 04/16/2019    9:34 AM  6CIT Screen  What Year? 0 points 0 points 0 points  What month? 0 points 0 points 0 points  What time? 0 points 0 points 0 points  Count back from 20 0 points 0 points 0 points  Months in reverse 0 points 0 points 0 points  Repeat phrase 0 points 0 points 0 points  Total Score 0 points 0 points 0 points     Immunizations Immunization History  Administered Date(s) Administered   Fluad Quad(high Dose 65+) 12/12/2018, 01/16/2020   Influenza, High Dose Seasonal PF 02/27/2018   Influenza-Unspecified 01/11/2021, 02/10/2022   PFIZER(Purple Top)SARS-COV-2 Vaccination 03/15/2019, 04/05/2019, 11/29/2019, 01/31/2021   Pneumococcal Conjugate-13 11/10/2016   Pneumococcal Polysaccharide-23 11/10/2015   Tdap 04/26/2016   Zoster, Live 07/29/2010    TDAP status: Up to date  Flu Vaccine status: Up to date  Pneumococcal vaccine status: Up to date  Covid-19 vaccine status: Completed vaccines  Qualifies for Shingles Vaccine? Yes   Zostavax completed No   Shingrix Completed?: No.    Education has been provided regarding the importance of this vaccine. Patient has been advised to call insurance company to determine out of pocket expense if they have not yet received this vaccine. Advised may also receive vaccine at local pharmacy or Health Dept. Verbalized acceptance and understanding.  Screening Tests Health Maintenance  Topic Date Due   Zoster Vaccines- Shingrix (1 of 2) Never done   COVID-19 Vaccine (5 - 2023-24 season) 10/23/2021   Hepatitis C Screening  04/07/2023 (Originally 02/16/1969)   INFLUENZA VACCINE  09/23/2022   Medicare Annual Wellness (AWV)  07/27/2023   DTaP/Tdap/Td (2 - Td or Tdap) 04/27/2026   Colonoscopy  03/05/2027   Pneumonia Vaccine 49+ Years old  Completed   HPV VACCINES  Aged Out    Health Maintenance  Health Maintenance Due  Topic Date Due   Zoster Vaccines- Shingrix (1 of 2) Never done   COVID-19 Vaccine (5 - 2023-24 season) 10/23/2021    Colorectal cancer screening: Type of screening: Colonoscopy. Completed 03/04/17. Repeat every 10 years  Additional Screening:  Hepatitis C Screening: does qualify  Vision Screening: Recommended annual ophthalmology exams for early detection of glaucoma and other disorders of the eye. Is the patient up to date with their  annual eye exam?  Yes  Who is the provider or what is the name of the office in which the patient attends annual eye exams? [facility]  If pt is not established with a provider, would they like to be referred to a provider to establish care? No .   Dental Screening: Recommended annual dental exams for proper oral hygiene  Community Resource Referral / Chronic Care Management: CRR required this visit?  No   CCM required this visit?  No      Plan:     I have personally reviewed and noted the following in the patient's chart:   Medical and social history Use of alcohol, tobacco or illicit drugs  Current medications and supplements including opioid prescriptions. Patient is not currently taking opioid prescriptions. Functional ability and status Nutritional status Physical activity Advanced directives List of other physicians Hospitalizations, surgeries, and ER visits in previous 12 months Vitals Screenings to include cognitive, depression, and falls Referrals and appointments  In addition, I have reviewed and discussed with patient certain preventive protocols, quality metrics, and best practice recommendations. A written personalized care plan for preventive services as well as general  preventive health recommendations were provided to patient.     Marzella Schlein, LPN   4/0/9811   Nurse Notes: none

## 2022-07-27 NOTE — Patient Instructions (Signed)
Nathan Collins , Thank you for taking time to come for your Medicare Wellness Visit. I appreciate your ongoing commitment to your health goals. Please review the following plan we discussed and let me know if I can assist you in the future.   These are the goals we discussed:  Goals      Patient Stated     Lose weight      Patient Stated     Lose weight        This is a list of the screening recommended for you and due dates:  Health Maintenance  Topic Date Due   Zoster (Shingles) Vaccine (1 of 2) Never done   COVID-19 Vaccine (5 - 2023-24 season) 10/23/2021   Hepatitis C Screening  04/07/2023*   Flu Shot  09/23/2022   Medicare Annual Wellness Visit  07/27/2023   DTaP/Tdap/Td vaccine (2 - Td or Tdap) 04/27/2026   Colon Cancer Screening  03/05/2027   Pneumonia Vaccine  Completed   HPV Vaccine  Aged Out  *Topic was postponed. The date shown is not the original due date.    Advanced directives: Please bring a copy of your health care power of attorney and living will to the office at your convenience.  Conditions/risks identified: lose weight   Next appointment: Follow up in one year for your annual wellness visit.   Preventive Care 21 Years and Older, Male  Preventive care refers to lifestyle choices and visits with your health care provider that can promote health and wellness. What does preventive care include? A yearly physical exam. This is also called an annual well check. Dental exams once or twice a year. Routine eye exams. Ask your health care provider how often you should have your eyes checked. Personal lifestyle choices, including: Daily care of your teeth and gums. Regular physical activity. Eating a healthy diet. Avoiding tobacco and drug use. Limiting alcohol use. Practicing safe sex. Taking low doses of aspirin every day. Taking vitamin and mineral supplements as recommended by your health care provider. What happens during an annual well check? The  services and screenings done by your health care provider during your annual well check will depend on your age, overall health, lifestyle risk factors, and family history of disease. Counseling  Your health care provider may ask you questions about your: Alcohol use. Tobacco use. Drug use. Emotional well-being. Home and relationship well-being. Sexual activity. Eating habits. History of falls. Memory and ability to understand (cognition). Work and work Astronomer. Screening  You may have the following tests or measurements: Height, weight, and BMI. Blood pressure. Lipid and cholesterol levels. These may be checked every 5 years, or more frequently if you are over 71 years old. Skin check. Lung cancer screening. You may have this screening every year starting at age 53 if you have a 30-pack-year history of smoking and currently smoke or have quit within the past 15 years. Fecal occult blood test (FOBT) of the stool. You may have this test every year starting at age 47. Flexible sigmoidoscopy or colonoscopy. You may have a sigmoidoscopy every 5 years or a colonoscopy every 10 years starting at age 27. Prostate cancer screening. Recommendations will vary depending on your family history and other risks. Hepatitis C blood test. Hepatitis B blood test. Sexually transmitted disease (STD) testing. Diabetes screening. This is done by checking your blood sugar (glucose) after you have not eaten for a while (fasting). You may have this done every 1-3 years. Abdominal aortic aneurysm (  AAA) screening. You may need this if you are a current or former smoker. Osteoporosis. You may be screened starting at age 65 if you are at high risk. Talk with your health care provider about your test results, treatment options, and if necessary, the need for more tests. Vaccines  Your health care provider may recommend certain vaccines, such as: Influenza vaccine. This is recommended every year. Tetanus,  diphtheria, and acellular pertussis (Tdap, Td) vaccine. You may need a Td booster every 10 years. Zoster vaccine. You may need this after age 69. Pneumococcal 13-valent conjugate (PCV13) vaccine. One dose is recommended after age 50. Pneumococcal polysaccharide (PPSV23) vaccine. One dose is recommended after age 78. Talk to your health care provider about which screenings and vaccines you need and how often you need them. This information is not intended to replace advice given to you by your health care provider. Make sure you discuss any questions you have with your health care provider. Document Released: 03/07/2015 Document Revised: 10/29/2015 Document Reviewed: 12/10/2014 Elsevier Interactive Patient Education  2017 [employer].  Fall Prevention in the Home Falls can cause injuries. They can happen to people of all ages. There are many things you can do to make your home safe and to help prevent falls. What can I do on the outside of my home? Regularly fix the edges of walkways and driveways and fix any cracks. Remove anything that might make you trip as you walk through a door, such as a raised step or threshold. Trim any bushes or trees on the path to your home. Use bright outdoor lighting. Clear any walking paths of anything that might make someone trip, such as rocks or tools. Regularly check to see if handrails are loose or broken. Make sure that both sides of any steps have handrails. Any raised decks and porches should have guardrails on the edges. Have any leaves, snow, or ice cleared regularly. Use sand or salt on walking paths during winter. Clean up any spills in your garage right away. This includes oil or grease spills. What can I do in the bathroom? Use night lights. Install grab bars by the toilet and in the tub and shower. Do not use towel bars as grab bars. Use non-skid mats or decals in the tub or shower. If you need to sit down in the shower, use a plastic,  non-slip stool. Keep the floor dry. Clean up any water that spills on the floor as soon as it happens. Remove soap buildup in the tub or shower regularly. Attach bath mats securely with double-sided non-slip rug tape. Do not have throw rugs and other things on the floor that can make you trip. What can I do in the bedroom? Use night lights. Make sure that you have a light by your bed that is easy to reach. Do not use any sheets or blankets that are too big for your bed. They should not hang down onto the floor. Have a firm chair that has side arms. You can use this for support while you get dressed. Do not have throw rugs and other things on the floor that can make you trip. What can I do in the kitchen? Clean up any spills right away. Avoid walking on wet floors. Keep items that you use a lot in easy-to-reach places. If you need to reach something above you, use a strong step stool that has a grab bar. Keep electrical cords out of the way. Do not use floor polish  or wax that makes floors slippery. If you must use wax, use non-skid floor wax. Do not have throw rugs and other things on the floor that can make you trip. What can I do with my stairs? Do not leave any items on the stairs. Make sure that there are handrails on both sides of the stairs and use them. Fix handrails that are broken or loose. Make sure that handrails are as long as the stairways. Check any carpeting to make sure that it is firmly attached to the stairs. Fix any carpet that is loose or worn. Avoid having throw rugs at the top or bottom of the stairs. If you do have throw rugs, attach them to the floor with carpet tape. Make sure that you have a light switch at the top of the stairs and the bottom of the stairs. If you do not have them, ask someone to add them for you. What else can I do to help prevent falls? Wear shoes that: Do not have high heels. Have rubber bottoms. Are comfortable and fit you well. Are closed  at the toe. Do not wear sandals. If you use a stepladder: Make sure that it is fully opened. Do not climb a closed stepladder. Make sure that both sides of the stepladder are locked into place. Ask someone to hold it for you, if possible. Clearly mark and make sure that you can see: Any grab bars or handrails. First and last steps. Where the edge of each step is. Use tools that help you move around (mobility aids) if they are needed. These include: Canes. Walkers. Scooters. Crutches. Turn on the lights when you go into a dark area. Replace any light bulbs as soon as they burn out. Set up your furniture so you have a clear path. Avoid moving your furniture around. If any of your floors are uneven, fix them. If there are any pets around you, be aware of where they are. Review your medicines with your doctor. Some medicines can make you feel dizzy. This can increase your chance of falling. Ask your doctor what other things that you can do to help prevent falls. This information is not intended to replace advice given to you by your health care provider. Make sure you discuss any questions you have with your health care provider. Document Released: 12/05/2008 Document Revised: 07/17/2015 Document Reviewed: 03/15/2014 Elsevier Interactive Patient Education  2017 [employer].

## 2022-08-04 ENCOUNTER — Other Ambulatory Visit: Payer: Self-pay | Admitting: Family Medicine

## 2022-08-08 ENCOUNTER — Other Ambulatory Visit: Payer: Self-pay | Admitting: Cardiovascular Disease

## 2022-08-10 NOTE — Telephone Encounter (Signed)
Please call pt to schedule 1 year follow-up appointment with Dr. Duke Salvia or APP for refills. Thank you!

## 2022-08-10 NOTE — Telephone Encounter (Signed)
Scheduled 11/10/22 with Dr. Coleman 

## 2022-08-10 NOTE — Telephone Encounter (Signed)
Rx request sent to pharmacy.  

## 2022-08-10 NOTE — Telephone Encounter (Signed)
Left message for patient to call and schedule overdue follow up appointment with Dr. Robbins/APP for medication refills 

## 2022-08-12 NOTE — Telephone Encounter (Signed)
Scheduled 08/31/22 with Gillian Shields, NP

## 2022-08-13 ENCOUNTER — Encounter (HOSPITAL_BASED_OUTPATIENT_CLINIC_OR_DEPARTMENT_OTHER): Payer: Self-pay

## 2022-08-13 ENCOUNTER — Other Ambulatory Visit: Payer: Self-pay | Admitting: Cardiovascular Disease

## 2022-08-13 NOTE — Telephone Encounter (Signed)
Rx(s) sent to pharmacy electronically.  

## 2022-08-31 ENCOUNTER — Encounter (HOSPITAL_BASED_OUTPATIENT_CLINIC_OR_DEPARTMENT_OTHER): Payer: Self-pay | Admitting: Family

## 2022-08-31 ENCOUNTER — Ambulatory Visit (INDEPENDENT_AMBULATORY_CARE_PROVIDER_SITE_OTHER): Payer: Medicare Other | Admitting: Family

## 2022-08-31 VITALS — BP 136/62 | HR 98 | Ht 67.0 in | Wt 220.0 lb

## 2022-08-31 DIAGNOSIS — I2510 Atherosclerotic heart disease of native coronary artery without angina pectoris: Secondary | ICD-10-CM | POA: Diagnosis not present

## 2022-08-31 DIAGNOSIS — I7121 Aneurysm of the ascending aorta, without rupture: Secondary | ICD-10-CM

## 2022-08-31 DIAGNOSIS — I10 Essential (primary) hypertension: Secondary | ICD-10-CM | POA: Diagnosis not present

## 2022-08-31 DIAGNOSIS — E785 Hyperlipidemia, unspecified: Secondary | ICD-10-CM

## 2022-08-31 MED ORDER — VALSARTAN 160 MG PO TABS: 160.0000 mg | ORAL_TABLET | Freq: Every day | ORAL | 3 refills | Status: DC

## 2022-08-31 MED ORDER — EZETIMIBE 10 MG PO TABS
10.0000 mg | ORAL_TABLET | Freq: Every day | ORAL | 3 refills | Status: DC
Start: 2022-08-31 — End: 2022-12-23

## 2022-08-31 MED ORDER — ATORVASTATIN CALCIUM 20 MG PO TABS: 20.0000 mg | ORAL_TABLET | Freq: Every day | ORAL | 3 refills | Status: DC

## 2022-08-31 NOTE — Patient Instructions (Signed)
Medication Instructions:  Continue your current medications.   *If you need a refill on your cardiac medications before your next appointment, please call your pharmacy*  Testing/Procedures: Your physician has requested that you have an echocardiogram 07/2023. Echocardiography is a painless test that uses sound waves to create images of your heart. It provides your doctor with information about the size and shape of your heart and how well your heart's chambers and valves are working. This procedure takes approximately one hour. There are no restrictions for this procedure. Please do NOT wear cologne, perfume, aftershave, or lotions (deodorant is allowed). Please arrive 15 minutes prior to your appointment time.    Follow-Up: At [facility], you and your health needs are our priority.  As part of our continuing mission to provide you with exceptional heart care, we have created designated Provider Care Teams.  These Care Teams include your primary Cardiologist (physician) and Advanced Practice Providers (APPs -  Physician Assistants and Nurse Practitioners) who all work together to provide you with the care you need, when you need it.  We recommend signing up for the patient portal called "MyChart".  Sign up information is provided on this After Visit Summary.  MyChart is used to connect with patients for Virtual Visits (Telemedicine).  Patients are able to view lab/test results, encounter notes, upcoming appointments, etc.  Non-urgent messages can be sent to your provider as well.   To learn more about what you can do with MyChart, go to [email or web address].    Your next appointment:   1 year(s)  Provider:   Chilton Si, MD or Gillian Shields, NP    Other Instructions  Tips to Measure your Blood Pressure Correctly  Check BP once per day at least an hour after medications and keep a log. We will send you a MyChart message in 2 weeks to check on BP.  Our goal is for your  BP to bess less than 130/80.   Here's what you can do to ensure a correct reading:  Don't drink a caffeinated beverage or smoke during the 30 minutes before the test.  Sit quietly for five minutes before the test begins.  During the measurement, sit in a chair with your feet on the floor and your arm supported so your elbow is at about heart level.  The inflatable part of the cuff should completely cover at least 80% of your upper arm, and the cuff should be placed on bare skin, not over a shirt.  Don't talk during the measurement.   Blood pressure categories  Blood pressure category SYSTOLIC (upper number)  DIASTOLIC (lower number)  Normal Less than 120 mm Hg and Less than 80 mm Hg  Elevated 120-129 mm Hg and Less than 80 mm Hg  High blood pressure: Stage 1 hypertension 130-139 mm Hg or 80-89 mm Hg  High blood pressure: Stage 2 hypertension 140 mm Hg or higher or 90 mm Hg or higher  Hypertensive crisis (consult your doctor immediately) Higher than 180 mm Hg and/or Higher than 120 mm Hg  Source: American Heart Association and American Stroke Association. For more on getting your blood pressure under control, buy Controlling Your Blood Pressure, a Special Health Report from [facility].   Blood Pressure Log   Date   Time  Blood Pressure  Example: Nov 1 9 AM 124/78

## 2022-08-31 NOTE — Progress Notes (Addendum)
 Cardiology Office Note:  .   Date:  09/05/2022  ID:  Nathan Collins, DOB 1950/11/26, MRN [id number] PCP: Kennyth Worth HERO, MD  [facility] Providers Cardiologist:  Annabella Scarce, MD Cardiology APP:  Army Katheryn BROCKS, NP (Inactive)    History of Present Illness: .   Nathan Collins is a 72 y.o. male with history of arthritis, [place] melanoma, CAD, hypertension, OSA on CPAP, gout, hyperlipidemia, thoracic ascending aortic aneurysm.  Previously followed by Katheryn Army, NP and has since established with Dr. Scarce.  Prior coronary CTA 11/2017 chronic calcium  score of 137 placing him the 53rd percentile with ascending aortic aneurysm 40 mm.  FFR was mildly abnormal in the distal OM1.  Follow-up chest CT 02/2020 with stable aneurysm.  Last seen 08/06/2021 by Dr. Scarce with no anginal symptoms recommended for repeat echocardiogram gram for monitoring of ascending aortic aneurysm in 2025.  Presents today for follow up independently. Excited for upcoming trip in September to [place] . Reports rare chest pains which self resolve and are fleeting. Overall not bothersome. BP cuff previously found to be accurate. Checking BP very rarely at home. Reports no shortness of breath nor dyspnea on exertion. Reports no chest pain, pressure, or tightness. No edema, orthopnea, PND. Reports no palpitations.    ROS: Please see the history of present illness.    All other systems reviewed and are negative.   Studies Reviewed: SABRA   EKG Interpretation Date/Time:  Tuesday August 31 2022 13:59:55 EDT Ventricular Rate:  98 PR Interval:  182 QRS Duration:  94 QT Interval:  336 QTC Calculation: 428 R Axis:   11  Text Interpretation: Normal sinus rhythm Normal ECG No acute ST/T wave changes. Confirmed by Vannie Mora ([id number]) on 08/31/2022 2:36:29 PM    Cardiac Studies & Procedures       ECHOCARDIOGRAM  ECHOCARDIOGRAM COMPLETE 08/03/2021  Narrative ECHOCARDIOGRAM  REPORT    Patient Name:   Nathan Collins Date of Exam: 08/03/2021 Medical Rec #:  [id number]        Height:       67.0 in Accession #:    [id number]       Weight:       227.0 lb Date of Birth:  Dec 02, 1950       BSA:          2.134 m Patient Age:    70 years         BP:           140/80 mmHg Patient Gender: M                HR:           61 bpm. Exam Location:  Outpatient  Procedure: 2D Echo, Strain Analysis, Cardiac Doppler and Color Doppler  Indications:    Aneurysm of ascending aorta without rupture (HCC)  History:        Patient has prior history of Echocardiogram examinations, most recent 01/17/2018. CAD; Risk Factors:Dyslipidemia, Hypertension and Non-Smoker. Ascending aortic aneurysm 4.0 08/05/2020.  Sonographer:    Orvil Holmes RDCS Referring Phys: [id number] TIFFANY [place]  IMPRESSIONS   1. Left ventricular ejection fraction, by estimation, is 55 to 60%. The left ventricle has normal function. The left ventricle has no regional wall motion abnormalities. There is mild concentric left ventricular hypertrophy. Left ventricular diastolic parameters are consistent with Grade I diastolic dysfunction (impaired relaxation). The average left ventricular global longitudinal strain is -16.5 %. The global  longitudinal strain is abnormal. 2. Right ventricular systolic function is normal. The right ventricular size is normal. There is normal pulmonary artery systolic pressure. 3. Left atrial size was severely dilated. 4. The mitral valve is normal in structure. Trivial mitral valve regurgitation. No evidence of mitral stenosis. 5. The aortic valve is tricuspid. There is mild calcification of the aortic valve. There is mild thickening of the aortic valve. Aortic valve regurgitation is trivial. No aortic stenosis is present. 6. Aortic dilatation noted. There is mild dilatation of the ascending aorta, measuring 40 mm. 7. The inferior vena cava is normal in size with greater than 50%  respiratory variability, suggesting right atrial pressure of 3 mmHg.  FINDINGS Left Ventricle: Left ventricular ejection fraction, by estimation, is 55 to 60%. The left ventricle has normal function. The left ventricle has no regional wall motion abnormalities. The average left ventricular global longitudinal strain is -16.5 %. The global longitudinal strain is abnormal. The left ventricular internal cavity size was normal in size. There is mild concentric left ventricular hypertrophy. Left ventricular diastolic parameters are consistent with Grade I diastolic dysfunction (impaired relaxation). Indeterminate filling pressures.  Right Ventricle: The right ventricular size is normal. No increase in right ventricular wall thickness. Right ventricular systolic function is normal. There is normal pulmonary artery systolic pressure. The tricuspid regurgitant velocity is 2.31 m/s, and with an assumed right atrial pressure of 3 mmHg, the estimated right ventricular systolic pressure is 24.3 mmHg.  Left Atrium: Left atrial size was severely dilated.  Right Atrium: Right atrial size was normal in size.  Pericardium: There is no evidence of pericardial effusion.  Mitral Valve: The mitral valve is normal in structure. Trivial mitral valve regurgitation. No evidence of mitral valve stenosis.  Tricuspid Valve: The tricuspid valve is normal in structure. Tricuspid valve regurgitation is trivial. No evidence of tricuspid stenosis.  Aortic Valve: The aortic valve is tricuspid. There is mild calcification of the aortic valve. There is mild thickening of the aortic valve. Aortic valve regurgitation is trivial. No aortic stenosis is present. Aortic valve mean gradient measures 5.0 mmHg. Aortic valve peak gradient measures 9.5 mmHg. Aortic valve area, by VTI measures 2.26 cm.  Pulmonic Valve: The pulmonic valve was normal in structure. Pulmonic valve regurgitation is not visualized. No evidence of pulmonic  stenosis.  Aorta: Aortic dilatation noted. There is mild dilatation of the ascending aorta, measuring 40 mm.  Venous: The inferior vena cava is normal in size with greater than 50% respiratory variability, suggesting right atrial pressure of 3 mmHg.  IAS/Shunts: No atrial level shunt detected by color flow Doppler.   LEFT VENTRICLE PLAX 2D LVIDd:         4.86 cm      Diastology LVIDs:         3.52 cm      LV e' medial:    6.96 cm/s LV PW:         1.38 cm      LV E/e' medial:  12.2 LV IVS:        1.19 cm      LV e' lateral:   9.14 cm/s LVOT diam:     1.90 cm      LV E/e' lateral: 9.3 LV SV:         65 LV SV Index:   31           2D Longitudinal Strain LVOT Area:     2.84 cm     2D Strain  GLS (A2C):   -17.1 % 2D Strain GLS (A3C):   -16.9 % 2D Strain GLS (A4C):   -15.5 % LV Volumes (MOD)            2D Strain GLS Avg:     -16.5 % LV vol d, MOD A2C: 144.0 ml LV vol d, MOD A4C: 202.0 ml LV vol s, MOD A2C: 79.8 ml LV vol s, MOD A4C: 103.0 ml 3D Volume EF: LV SV MOD A2C:     64.2 ml  3D EF:        59 % LV SV MOD A4C:     202.0 ml LV EDV:       130 ml LV SV MOD BP:      79.6 ml  LV ESV:       54 ml LV SV:        76 ml  RIGHT VENTRICLE RV Basal diam:  4.34 cm RV Mid diam:    3.70 cm RV S prime:     15.20 cm/s TAPSE (M-mode): 3.0 cm  LEFT ATRIUM             Index        RIGHT ATRIUM           Index LA diam:        4.90 cm 2.30 cm/m   RA Area:     20.00 cm LA Vol (A2C):   62.6 ml 29.33 ml/m  RA Volume:   55.60 ml  26.05 ml/m LA Vol (A4C):   84.6 ml 39.64 ml/m LA Biplane Vol: 74.9 ml 35.10 ml/m AORTIC VALVE AV Area (Vmax):    1.60 cm AV Area (Vmean):   1.89 cm AV Area (VTI):     2.26 cm AV Vmax:           154.00 cm/s AV Vmean:          102.000 cm/s AV VTI:            0.290 m AV Peak Grad:      9.5 mmHg AV Mean Grad:      5.0 mmHg LVOT Vmax:         86.70 cm/s LVOT Vmean:        68.000 cm/s LVOT VTI:          0.231 m LVOT/AV VTI ratio: 0.80 AR Vena Contracta: 0.23  cm  AORTA Ao Root diam: 3.10 cm Ao Asc diam:  4.00 cm  MITRAL VALVE               TRICUSPID VALVE MV Area (PHT): 2.99 cm    TR Peak grad:   21.3 mmHg MV Decel Time: 254 msec    TR Vmax:        231.00 cm/s MV E velocity: 84.90 cm/s MV A velocity: 91.70 cm/s  SHUNTS MV E/A ratio:  0.93        Systemic VTI:  0.23 m Systemic Diam: 1.90 cm  Annabella Scarce MD Electronically signed by Annabella Scarce MD Signature Date/Time: 08/03/2021/12:24:26 PM    Final     CT SCANS  CT CORONARY MORPH W/CTA COR W/SCORE 02/20/2018  Addendum 02/20/2018  1:41 PM ADDENDUM REPORT: 02/20/2018 13:39  CLINICAL DATA:  Chest pain  EXAM: Cardiac CTA  MEDICATIONS: Sub lingual nitro. 4 mg and lopressor  5mg   TECHNIQUE: The patient was scanned on a [employer] 192 scanner. Gantry rotation speed was 250 msecs. Collimation was. 6 mm . A 120 kV prospective scan was  triggered in the ascending thoracic aorta at 140 HU's with full mA between 30-70% of the R-R interval . Average HR during the scan was 60 bpm. The 3D data set was interpreted on a dedicated work station using MPR, MIP and VRT modes. A total of 80 cc of contrast was used.  FINDINGS: Non-cardiac: See separate report from [facility]. No significant findings on limited lung and soft tissue windows.  Calcium  score: Calcium  noted in proximal LAD/circumflex and D1  Coronary Arteries: Left  dominant with no anomalies  LM: Normal  LAD: Less than 30% calcific plaque proximally 50% calcific plaque prior to take-off of D2  D1: 50% or less calcific stenosis  D2: Less than 50% calcific plaque  Circumflex: Less than 30% calcific stenosis proximally  OM1: Normal  OM2: Normal  PDA: Normal  PLA Normal  RCA: Normal  IMPRESSION: 1.  Calcium  score 137 which is 53 rd percentile for age and sex  2.  Mild aortic root dilatation 4.0 cm  3. CAD worst lesion in mid LAD prior to D2 take off 50% Study will be sent for FFR  CT  Maude Emmer   Electronically Signed By: Maude Emmer M.D. On: 02/20/2018 13:39  Narrative EXAM: OVER-READ INTERPRETATION  CT CHEST  The following report is an over-read performed by radiologist Dr. Franky Crease of [facility] on 02/20/2018. This over-read does not include interpretation of cardiac or coronary anatomy or pathology. The coronary CTA interpretation by the cardiologist is attached.  COMPARISON:  None.  FINDINGS: Vascular: Early aneurysmal dilatation of the ascending thoracic aorta, 4 cm maximally. Heart is borderline in size.  Mediastinum/Nodes: No adenopathy in the lower mediastinum or hila.  Lungs/Pleura: No confluent opacities or effusions.  Upper Abdomen: Imaging into the upper abdomen shows no acute findings.  Musculoskeletal: Chest wall soft tissues are unremarkable. No acute bony abnormality. Degenerative spurring anteriorly in the visualized thoracic spine.  IMPRESSION: 4 cm ascending thoracic aortic aneurysm. Recommend annual imaging followup by CTA or MRA. This recommendation follows 2010 ACCF/AHA/AATS/ACR/ASA/SCA/SCAI/SIR/STS/SVM Guidelines for the Diagnosis and Management of Patients with Thoracic Aortic Disease. Circulation. 2010; 121: z733-z630  Electronically Signed: By: Franky Crease M.D. On: 02/20/2018 12:57          Risk Assessment/Calculations:           Physical Exam:   VS:  BP 136/62   Pulse 98   Ht 5' 7 (1.702 m)   Wt 220 lb (99.8 kg)   BMI 34.46 kg/m    Wt Readings from Last 3 Encounters:  08/31/22 220 lb (99.8 kg)  07/27/22 217 lb (98.4 kg)  04/06/22 217 lb (98.4 kg)    GEN: Well nourished, well developed in no acute distress NECK: No JVD; No carotid bruits CARDIAC: RRR, no murmurs, rubs, gallops RESPIRATORY:  Clear to auscultation without rales, wheezing or rhonchi  ABDOMEN: Soft, non-tender, non-distended EXTREMITIES:  No edema; No deformity   ASSESSMENT AND PLAN: .    CAD / HLD, LDL goal  <70 - Stable with no anginal symptoms. No indication for ischemic evaluation.  Recommend aiming for 150 minutes of moderate intensity activity per week and following a heart healthy diet.  GDMT Atorvastatin  20mg  every day and Zetia  10mg  every day.   Ascending aortic aneurysm - stable 40mm by CT 2019 and 2022. Continue optimal BP control. Due for echo 07/2023 which we will order today.   HTN - initial BP 146/62 with repeat 136/62. He will monitor at home for  2 weeks and then report readings. If persistently >130/80 plan to increase dose of Valsartan  vs transition to Amlodipine-Valsartan .        Dispo: follow up in 1 year   Signed, Reche GORMAN Finder, NP

## 2022-09-01 DIAGNOSIS — R208 Other disturbances of skin sensation: Secondary | ICD-10-CM | POA: Diagnosis not present

## 2022-09-01 DIAGNOSIS — L728 Other follicular cysts of the skin and subcutaneous tissue: Secondary | ICD-10-CM | POA: Diagnosis not present

## 2022-09-05 ENCOUNTER — Encounter (HOSPITAL_BASED_OUTPATIENT_CLINIC_OR_DEPARTMENT_OTHER): Payer: Self-pay | Admitting: Family

## 2022-09-14 ENCOUNTER — Encounter (HOSPITAL_BASED_OUTPATIENT_CLINIC_OR_DEPARTMENT_OTHER): Payer: Self-pay

## 2022-09-27 DIAGNOSIS — Z48817 Encounter for surgical aftercare following surgery on the skin and subcutaneous tissue: Secondary | ICD-10-CM | POA: Diagnosis not present

## 2022-10-18 ENCOUNTER — Other Ambulatory Visit: Payer: Self-pay | Admitting: Family Medicine

## 2022-10-24 ENCOUNTER — Other Ambulatory Visit: Payer: Self-pay | Admitting: Family Medicine

## 2022-10-26 ENCOUNTER — Other Ambulatory Visit: Payer: Self-pay | Admitting: Family Medicine

## 2022-10-29 ENCOUNTER — Other Ambulatory Visit (HOSPITAL_COMMUNITY): Payer: Self-pay

## 2022-10-29 ENCOUNTER — Other Ambulatory Visit: Payer: Self-pay

## 2022-11-12 ENCOUNTER — Telehealth: Payer: Self-pay | Admitting: Family

## 2022-11-12 NOTE — Telephone Encounter (Signed)
*  STAT* If patient is at the pharmacy, call can be transferred to refill team.   1. Which medications need to be refilled? (please list name of each medication and dose if known) valsartan (DIOVAN) 160 MG tablet    2. Would you like to learn more about the convenience, safety, & potential cost savings by using the [facility]? No   3. Are you open to using the [facility] (Type [facility]. ) No   4. Which pharmacy/location (including street and city if local pharmacy) is medication to be sent to?  [id number] - [place], [place] - [address]     5. Do they need a 30 day or 90 day supply? 90 day

## 2022-11-15 NOTE — Telephone Encounter (Signed)
Tried to call pt back mailbox is full. Pt's valsartan was filled in July for 1 year supply to [facility].  This medication is to soon to fill.

## 2022-12-23 ENCOUNTER — Encounter (HOSPITAL_BASED_OUTPATIENT_CLINIC_OR_DEPARTMENT_OTHER): Payer: Self-pay | Admitting: Cardiovascular Disease

## 2022-12-23 DIAGNOSIS — I2510 Atherosclerotic heart disease of native coronary artery without angina pectoris: Secondary | ICD-10-CM

## 2022-12-23 DIAGNOSIS — E785 Hyperlipidemia, unspecified: Secondary | ICD-10-CM

## 2022-12-23 DIAGNOSIS — I10 Essential (primary) hypertension: Secondary | ICD-10-CM

## 2022-12-23 MED ORDER — VALSARTAN 160 MG PO TABS: 160.0000 mg | ORAL_TABLET | Freq: Every day | ORAL | 3 refills | Status: DC

## 2022-12-23 MED ORDER — ATORVASTATIN CALCIUM 20 MG PO TABS: 20.0000 mg | ORAL_TABLET | Freq: Every day | ORAL | 3 refills | Status: DC

## 2022-12-23 MED ORDER — EZETIMIBE 10 MG PO TABS
10.0000 mg | ORAL_TABLET | Freq: Every day | ORAL | 3 refills | Status: DC
Start: 2022-12-23 — End: 2024-01-04

## 2023-01-23 ENCOUNTER — Other Ambulatory Visit: Payer: Self-pay | Admitting: Family Medicine

## 2023-01-24 MED ORDER — ALLOPURINOL 100 MG PO TABS: 200.0000 mg | ORAL_TABLET | Freq: Every morning | ORAL | 0 refills | Status: DC

## 2023-03-07 ENCOUNTER — Ambulatory Visit (INDEPENDENT_AMBULATORY_CARE_PROVIDER_SITE_OTHER): Payer: Medicare Other | Admitting: Family Medicine

## 2023-03-07 ENCOUNTER — Encounter: Payer: Self-pay | Admitting: Family Medicine

## 2023-03-07 VITALS — BP 134/82 | HR 70 | Temp 99.1°F | Ht 67.0 in | Wt 224.2 lb

## 2023-03-07 DIAGNOSIS — I10 Essential (primary) hypertension: Secondary | ICD-10-CM

## 2023-03-07 DIAGNOSIS — E785 Hyperlipidemia, unspecified: Secondary | ICD-10-CM | POA: Diagnosis not present

## 2023-03-07 NOTE — Patient Instructions (Signed)
 It was very nice to see you today!  We flushed out your ears today.   You can use hydrogen peroxide or Debrox as needed to prevent buildup.  Return for Annual Physical.   Take care, Dr Kennyth  PLEASE NOTE:  If you had any lab tests, please let us  know if you have not heard back within a few days. You may see your results on mychart before we have a chance to review them but we will give you a call once they are reviewed by us .   If we ordered any referrals today, please let us  know if you have not heard from their office within the next week.   If you had any urgent prescriptions sent in today, please check with the pharmacy within an hour of our visit to make sure the prescription was transmitted appropriately.   Please try these tips to maintain a healthy lifestyle:  Eat at least 3 REAL meals and 1-2 snacks per day.  Aim for no more than 5 hours between eating.  If you eat breakfast, please do so within one hour of getting up.   Each meal should contain half fruits/vegetables, one quarter protein, and one quarter carbs (no bigger than a computer mouse)  Cut down on sweet beverages. This includes juice, soda, and sweet tea.   Drink at least 1 glass of water with each meal and aim for at least 8 glasses per day  Exercise at least 150 minutes every week.

## 2023-03-07 NOTE — Assessment & Plan Note (Signed)
 Follows with cardiology.  Will check lipids when he comes back in a few weeks for physical.  He is on Lipitor 20 mg daily and Zetia 10 mg daily.

## 2023-03-07 NOTE — Progress Notes (Signed)
   Nathan Collins is a 73 y.o. male who presents today for an office visit.  Assessment/Plan:  New/Acute Problems: Cerumen Impaction  Irrigated by RMA today.  He can use Debrox or hydrogen peroxide as needed to prevent buildup.  Chronic Problems Addressed Today: Essential hypertension Blood pressure at goal on valsartan  160 mg daily.  Dyslipidemia Follows with cardiology.  Will check lipids when he comes back in a few weeks for physical.  He is on Lipitor 20 mg daily and Zetia  10 mg daily.     Subjective:  HPI:  See Assessment / plan for status of chronic conditions. He is here today with cerumen impaction. Recently saw audiology for hearing aid check and was found to have impaction.  He has noticed decreased hearing.       Objective:  Physical Exam: BP 134/82   Pulse 70   Temp 99.1 F (37.3 C) (Temporal)   Ht 5' 7 (1.702 m)   Wt 224 lb 3.2 oz (101.7 kg)   SpO2 97%   BMI 35.11 kg/m   Gen: No acute distress, resting comfortably HEENT: Bilateral EAC with cerumen impaction. CV: Regular rate and rhythm with no murmurs appreciated Pulm: Normal work of breathing, clear to auscultation bilaterally with no crackles, wheezes, or rhonchi Neuro: Grossly normal, moves all extremities Psych: Normal affect and thought content      Sayde Lish M. Kennyth, MD 03/07/2023 9:26 AM

## 2023-03-07 NOTE — Assessment & Plan Note (Signed)
Blood pressure at goal on valsartan 160 mg daily.

## 2023-03-21 DIAGNOSIS — H25813 Combined forms of age-related cataract, bilateral: Secondary | ICD-10-CM | POA: Diagnosis not present

## 2023-03-21 DIAGNOSIS — H35373 Puckering of macula, bilateral: Secondary | ICD-10-CM | POA: Diagnosis not present

## 2023-03-21 DIAGNOSIS — H35342 Macular cyst, hole, or pseudohole, left eye: Secondary | ICD-10-CM | POA: Diagnosis not present

## 2023-03-21 DIAGNOSIS — H52223 Regular astigmatism, bilateral: Secondary | ICD-10-CM | POA: Diagnosis not present

## 2023-03-28 DIAGNOSIS — L719 Rosacea, unspecified: Secondary | ICD-10-CM | POA: Diagnosis not present

## 2023-03-28 DIAGNOSIS — L739 Follicular disorder, unspecified: Secondary | ICD-10-CM | POA: Diagnosis not present

## 2023-04-08 ENCOUNTER — Encounter: Payer: Self-pay | Admitting: Family Medicine

## 2023-04-08 ENCOUNTER — Ambulatory Visit: Payer: Medicare Other | Admitting: Family Medicine

## 2023-04-08 VITALS — BP 144/94 | HR 74 | Temp 98.6°F | Ht 66.5 in | Wt 222.6 lb

## 2023-04-08 DIAGNOSIS — E785 Hyperlipidemia, unspecified: Secondary | ICD-10-CM

## 2023-04-08 DIAGNOSIS — I10 Essential (primary) hypertension: Secondary | ICD-10-CM

## 2023-04-08 DIAGNOSIS — Z8042 Family history of malignant neoplasm of prostate: Secondary | ICD-10-CM | POA: Diagnosis not present

## 2023-04-08 DIAGNOSIS — R739 Hyperglycemia, unspecified: Secondary | ICD-10-CM | POA: Diagnosis not present

## 2023-04-08 DIAGNOSIS — T161XXA Foreign body in right ear, initial encounter: Secondary | ICD-10-CM | POA: Diagnosis not present

## 2023-04-08 DIAGNOSIS — Z1159 Encounter for screening for other viral diseases: Secondary | ICD-10-CM | POA: Diagnosis not present

## 2023-04-08 DIAGNOSIS — M109 Gout, unspecified: Secondary | ICD-10-CM

## 2023-04-08 DIAGNOSIS — Z125 Encounter for screening for malignant neoplasm of prostate: Secondary | ICD-10-CM | POA: Diagnosis not present

## 2023-04-08 LAB — COMPREHENSIVE METABOLIC PANEL
ALT: 27 U/L (ref 0–53)
AST: 25 U/L (ref 0–37)
Albumin: 4.4 g/dL (ref 3.5–5.2)
Alkaline Phosphatase: 93 U/L (ref 39–117)
BUN: 19 mg/dL (ref 6–23)
CO2: 32 meq/L (ref 19–32)
Calcium: 9.4 mg/dL (ref 8.4–10.5)
Chloride: 105 meq/L (ref 96–112)
Creatinine, Ser: 1.31 mg/dL (ref 0.40–1.50)
GFR: 54.14 mL/min — ABNORMAL LOW (ref >60.00)
Glucose, Bld: 113 mg/dL — ABNORMAL HIGH (ref 70–99)
Potassium: 4.9 meq/L (ref 3.5–5.1)
Sodium: 144 meq/L (ref 135–145)
Total Bilirubin: 0.7 mg/dL (ref 0.2–1.2)
Total Protein: 6.5 g/dL (ref 6.0–8.3)

## 2023-04-08 LAB — LIPID PANEL
Cholesterol: 139 mg/dL (ref 0–200)
HDL: 37.1 mg/dL — ABNORMAL LOW (ref >39.00)
LDL Cholesterol: 79 mg/dL (ref 0–99)
NonHDL: 102.24
Total CHOL/HDL Ratio: 4
Triglycerides: 118 mg/dL (ref 0.0–149.0)
VLDL: 23.6 mg/dL (ref 0.0–40.0)

## 2023-04-08 LAB — CBC
HCT: 45.6 % (ref 39.0–52.0)
Hemoglobin: 15.2 g/dL (ref 13.0–17.0)
MCHC: 33.3 g/dL (ref 30.0–36.0)
MCV: 89.3 fL (ref 78.0–100.0)
Platelets: 172 10*3/uL (ref 150.0–400.0)
RBC: 5.1 Mil/uL (ref 4.22–5.81)
RDW: 14.4 % (ref 11.5–15.5)
WBC: 7.3 10*3/uL (ref 4.0–10.5)

## 2023-04-08 LAB — TSH: TSH: 1.85 u[IU]/mL (ref 0.35–5.50)

## 2023-04-08 LAB — HEMOGLOBIN A1C: Hgb A1c MFr Bld: 6 % (ref 4.6–6.5)

## 2023-04-08 LAB — PSA, MEDICARE: PSA: 1.81 ng/mL (ref 0.10–4.00)

## 2023-04-08 LAB — URIC ACID: Uric Acid, Serum: 6.8 mg/dL (ref 4.0–7.8)

## 2023-04-08 NOTE — Assessment & Plan Note (Signed)
Mildly elevated today.  He is typically been well-controlled on valsartan 160 mg daily.  We will continue for now.  He will monitor at home and let us know if persistently elevated.  Check labs today.

## 2023-04-08 NOTE — Assessment & Plan Note (Signed)
Check A1c.  We discussed lifestyle modifications.

## 2023-04-08 NOTE — Progress Notes (Addendum)
 Nathan Collins is a 73 y.o. male who presents today for an office visit.  Assessment/Plan:  New/Acute Problems: Facial Pain No obvious abnormalities on exam.  His initial lesion may have been a mild shingles outbreak however difficult to determine at this point.  No signs of bacterial infection.  Overall symptoms are very mild and manageable.  We did discuss potential CT scan to look for internal underlying etiologies however given that symptoms been stable and symptoms are very mild would be reasonable for [place] to hold off on this at this point.  Will continue with watchful waiting.  He will let us know if symptoms change.  Ear Foreign Body Successfully removed is retained hearing aid tip today.  See below procedure note.  He tolerated well.   Chronic Problems Addressed Today: Essential hypertension Mildly elevated today.  He is typically been well-controlled on valsartan 160 mg daily.  We will continue for now.  He will monitor at home and let us know if persistently elevated.  Check labs today.  Dyslipidemia Stable on Lipitor 20 mg daily and Zetia 10 mg daily.  Check lipids.  We discussed lifestyle modifications.  Hyperglycemia Check A1c.  We discussed lifestyle modifications.  Gout Stable on allopurinol 200 mg daily.  No recent flares.  Also has colchicine to use as needed.  Will check uric acid level today.  Family history of prostate cancer Check PSA.     Subjective:  HPI:  See Assessment / plan for status of chronic conditions.  Patient is here today for his annual visit.  I last saw him a few weeks ago.  Since our last visit his wife was diagnosed with Parkinson's.  This has been a stressful period of time for him and his wife.  He is managing this as best as can be expected.  He has been following with eye doctor since her last visit and will be having cataract surgery soon.  He has been compliant with his medications.  He is trying to work on diet and exercise.  He has  not had any side effects with his medications.  He has noticed some facial pain on the right side of his forehead for the last year or so.  Located just medial to his right eyebrow.  This started initially as a small red bump.  This got better after a week or 2 but since then has had pain to the area when pushing on it.  The initial bump never performed and had.  No drainage.  No pustules or purulence.  No blistering.  Symptoms been stable the last several months.       Objective:  Physical Exam: BP (!) 144/94   Pulse 74   Temp 98.6 F (37 C) (Temporal)   Ht 5' 6.5" (1.689 m)   Wt 222 lb 9.6 oz (101 kg)   SpO2 96%   BMI 35.39 kg/m   Gen: No acute distress, resting comfortably HEENT: Left EAC clear.  Right EAC with retained hearing aid tip. CV: Regular rate and rhythm with no murmurs appreciated Pulm: Normal work of breathing, clear to auscultation bilaterally with no crackles, wheezes, or rhonchi Skin: Forehead without obvious abnormalities.  Tenderness to palpation along medial right brow ridge. Neuro: CN2-12 intact. Psych: Normal affect and thought content  Procedure note Verbal consent was obtained.  The foreign body in his right EAC was visualized with otoscope.  This was grasped using alligator forceps and gently removed.  The remainder of his  EAC was then examined without any obvious lesions or bleeding.  TM was intact.  He tolerated well.  No bleeding.  No complications.  Time Spent: 40 minutes of total time was spent on the date of the encounter performing the following actions: chart review prior to seeing the patient, obtaining history, performing a medically necessary exam, counseling on the treatment plan, placing orders, and documenting in our EHR. This time does not include time spent on performing the above foreign body removal.        Kristofor Michalowski M. Jimmey Ralph, MD 04/08/2023 9:35 AM

## 2023-04-08 NOTE — Patient Instructions (Addendum)
It was very nice to see you today!  We will check blood work today.  We removed the hearing aid tip from your ear today.  Please continue to work on diet and exercise.  Let us know if your facial pain worsens.  I will see back in a year.  Come back sooner if needed.  Return in about 1 year (around 04/07/2024) for Annual Physical.   Take care, Dr Jimmey Ralph  PLEASE NOTE:  If you had any lab tests, please let us know if you have not heard back within a few days. You may see your results on mychart before we have a chance to review them but we will give you a call once they are reviewed by [place].   If we ordered any referrals today, please let us know if you have not heard from their office within the next week.   If you had any urgent prescriptions sent in today, please check with the pharmacy within an hour of our visit to make sure the prescription was transmitted appropriately.   Please try these tips to maintain a healthy lifestyle:  Eat at least 3 REAL meals and 1-2 snacks per day.  Aim for no more than 5 hours between eating.  If you eat breakfast, please do so within one hour of getting up.   Each meal should contain half fruits/vegetables, one quarter protein, and one quarter carbs (no bigger than a computer mouse)  Cut down on sweet beverages. This includes juice, soda, and sweet tea.   Drink at least 1 glass of water with each meal and aim for at least 8 glasses per day  Exercise at least 150 minutes every week.     Preventive Care 20 Years and Older, Male Preventive care refers to lifestyle choices and visits with your health care provider that can promote health and wellness. Preventive care visits are also called wellness exams. What can I expect for my preventive care visit? Counseling During your preventive care visit, your health care provider may ask about your: Medical history, including: Past medical problems. Family medical history. History of falls. Current  health, including: Emotional well-being. Home life and relationship well-being. Sexual activity. Memory and ability to understand (cognition). Lifestyle, including: Alcohol, nicotine or tobacco, and drug use. Access to firearms. Diet, exercise, and sleep habits. Work and work Astronomer. Sunscreen use. Safety issues such as seatbelt and bike helmet use. Physical exam Your health care provider will check your: Height and weight. These may be used to calculate your BMI (body mass index). BMI is a measurement that tells if you are at a healthy weight. Waist circumference. This measures the distance around your waistline. This measurement also tells if you are at a healthy weight and may help predict your risk of certain diseases, such as type 2 diabetes and high blood pressure. Heart rate and blood pressure. Body temperature. Skin for abnormal spots. What immunizations do I need?  Vaccines are usually given at various ages, according to a schedule. Your health care provider will recommend vaccines for you based on your age, medical history, and lifestyle or other factors, such as travel or where you work. What tests do I need? Screening Your health care provider may recommend screening tests for certain conditions. This may include: Lipid and cholesterol levels. Diabetes screening. This is done by checking your blood sugar (glucose) after you have not eaten for a while (fasting). Hepatitis C test. Hepatitis B test. HIV (human immunodeficiency virus) test. STI (  sexually transmitted infection) testing, if you are at risk. Lung cancer screening. Colorectal cancer screening. Prostate cancer screening. Abdominal aortic aneurysm (AAA) screening. You may need this if you are a current or former smoker. Talk with your health care provider about your test results, treatment options, and if necessary, the need for more tests. Follow these instructions at home: Eating and drinking  Eat a  diet that includes fresh fruits and vegetables, whole grains, lean protein, and low-fat dairy products. Limit your intake of foods with high amounts of sugar, saturated fats, and salt. Take vitamin and mineral supplements as recommended by your health care provider. Do not drink alcohol if your health care provider tells you not to drink. If you drink alcohol: Limit how much you have to 0-2 drinks a day. Know how much alcohol is in your drink. In the [place] one drink equals one 12 oz bottle of beer (355 mL), one 5 oz glass of wine (148 mL), or one 1 oz glass of hard liquor (44 mL). Lifestyle Brush your teeth every morning and night with fluoride toothpaste. Floss one time each day. Exercise for at least 30 minutes 5 or more days each week. Do not use any products that contain nicotine or tobacco. These products include cigarettes, chewing tobacco, and vaping devices, such as e-cigarettes. If you need help quitting, ask your health care provider. Do not use drugs. If you are sexually active, practice safe sex. Use a condom or other form of protection to prevent STIs. Take aspirin only as told by your health care provider. Make sure that you understand how much to take and what form to take. Work with your health care provider to find out whether it is safe and beneficial for you to take aspirin daily. Ask your health care provider if you need to take a cholesterol-lowering medicine (statin). Find healthy ways to manage stress, such as: Meditation, yoga, or listening to music. Journaling. Talking to a trusted person. Spending time with friends and family. Safety Always wear your seat belt while driving or riding in a vehicle. Do not drive: If you have been drinking alcohol. Do not ride with someone who has been drinking. When you are tired or distracted. While texting. If you have been using any mind-altering substances or drugs. Wear a helmet and other protective equipment during sports  activities. If you have firearms in your house, make sure you follow all gun safety procedures. Minimize exposure to UV radiation to reduce your risk of skin cancer. What's next? Visit your health care provider once a year for an annual wellness visit. Ask your health care provider how often you should have your eyes and teeth checked. Stay up to date on all vaccines. This information is not intended to replace advice given to you by your health care provider. Make sure you discuss any questions you have with your health care provider. Document Revised: 08/06/2020 Document Reviewed: 08/06/2020 Elsevier Patient Education  2024 [employer].

## 2023-04-08 NOTE — Assessment & Plan Note (Signed)
Check PSA. ?

## 2023-04-08 NOTE — Assessment & Plan Note (Signed)
Stable on allopurinol 200 mg daily.  No recent flares.  Also has colchicine to use as needed.  Will check uric acid level today.

## 2023-04-08 NOTE — Assessment & Plan Note (Signed)
Stable on Lipitor 20 mg daily and Zetia 10 mg daily.  Check lipids.  We discussed lifestyle modifications.

## 2023-04-09 LAB — HEPATITIS C ANTIBODY: Hepatitis C Ab: NONREACTIVE

## 2023-04-12 ENCOUNTER — Encounter: Payer: Self-pay | Admitting: Family Medicine

## 2023-04-12 NOTE — Progress Notes (Signed)
His A1c is borderline but stable compared to previous values.  His good cholesterol is a little low but stable compared to previous values.  Do not need to make any medication changes for these however he should work on diet and exercise and we can recheck again in a year or so.  His kidney function shows some age-related decline but also stable compared to the past few years.  The rest of his labs are all at goal.  Do not need to make any changes to his treatment plan at this time.  He should continue to work on diet and exercise and we can recheck everything in a year or so.

## 2023-04-21 ENCOUNTER — Other Ambulatory Visit: Payer: Self-pay | Admitting: Family Medicine

## 2023-06-14 DIAGNOSIS — H18413 Arcus senilis, bilateral: Secondary | ICD-10-CM | POA: Diagnosis not present

## 2023-06-14 DIAGNOSIS — H35372 Puckering of macula, left eye: Secondary | ICD-10-CM | POA: Diagnosis not present

## 2023-06-14 DIAGNOSIS — H2513 Age-related nuclear cataract, bilateral: Secondary | ICD-10-CM | POA: Diagnosis not present

## 2023-06-14 DIAGNOSIS — H25013 Cortical age-related cataract, bilateral: Secondary | ICD-10-CM | POA: Diagnosis not present

## 2023-06-14 DIAGNOSIS — H25043 Posterior subcapsular polar age-related cataract, bilateral: Secondary | ICD-10-CM | POA: Diagnosis not present

## 2023-06-14 DIAGNOSIS — H2511 Age-related nuclear cataract, right eye: Secondary | ICD-10-CM | POA: Diagnosis not present

## 2023-06-23 DIAGNOSIS — G4733 Obstructive sleep apnea (adult) (pediatric): Secondary | ICD-10-CM | POA: Diagnosis not present

## 2023-06-28 DIAGNOSIS — L739 Follicular disorder, unspecified: Secondary | ICD-10-CM | POA: Diagnosis not present

## 2023-06-28 DIAGNOSIS — L820 Inflamed seborrheic keratosis: Secondary | ICD-10-CM | POA: Diagnosis not present

## 2023-06-28 DIAGNOSIS — L719 Rosacea, unspecified: Secondary | ICD-10-CM | POA: Diagnosis not present

## 2023-07-29 ENCOUNTER — Other Ambulatory Visit: Payer: Self-pay | Admitting: Family Medicine

## 2023-08-01 ENCOUNTER — Ambulatory Visit (INDEPENDENT_AMBULATORY_CARE_PROVIDER_SITE_OTHER): Payer: Medicare Other

## 2023-08-01 ENCOUNTER — Ambulatory Visit (HOSPITAL_BASED_OUTPATIENT_CLINIC_OR_DEPARTMENT_OTHER): Payer: Self-pay | Admitting: Family

## 2023-08-01 DIAGNOSIS — I10 Essential (primary) hypertension: Secondary | ICD-10-CM

## 2023-08-01 DIAGNOSIS — I2510 Atherosclerotic heart disease of native coronary artery without angina pectoris: Secondary | ICD-10-CM

## 2023-08-01 DIAGNOSIS — I7121 Aneurysm of the ascending aorta, without rupture: Secondary | ICD-10-CM

## 2023-08-01 DIAGNOSIS — E785 Hyperlipidemia, unspecified: Secondary | ICD-10-CM | POA: Diagnosis not present

## 2023-08-01 LAB — ECHOCARDIOGRAM COMPLETE
Area-P 1/2: 3.37 cm2
S' Lateral: 3.15 cm

## 2023-08-03 ENCOUNTER — Ambulatory Visit

## 2023-08-03 VITALS — Ht 67.0 in | Wt 222.0 lb

## 2023-08-03 DIAGNOSIS — Z0000 Encounter for general adult medical examination without abnormal findings: Secondary | ICD-10-CM | POA: Diagnosis not present

## 2023-08-03 NOTE — Patient Instructions (Signed)
 Mr. Nathan Collins , Thank you for taking time out of your busy schedule to complete your Annual Wellness Visit with me. I enjoyed our conversation and look forward to speaking with you again next year. I, as well as your care team,  appreciate your ongoing commitment to your health goals. Please review the following plan we discussed and let me know if I can assist you in the future. Your Game plan/ To Do List    Referrals: If you haven't heard from the office you've been referred to, please reach out to them at the phone provided.   Follow up Visits: Next Medicare AWV with our clinical staff: 08/07/24   Have you seen your provider in the last 6 months (3 months if uncontrolled diabetes)? Yes Next Office Visit with your provider: 04/10/24  Clinician Recommendations:  Aim for 30 minutes of exercise or brisk walking, 6-8 glasses of water, and 5 servings of fruits and vegetables each day.       This is a list of the screening recommended for you and due dates:  Health Maintenance  Topic Date Due   COVID-19 Vaccine (5 - 2024-25 season) 10/24/2022   Flu Shot  09/23/2023   Medicare Annual Wellness Visit  08/02/2024   DTaP/Tdap/Td vaccine (2 - Td or Tdap) 04/27/2026   Colon Cancer Screening  09/18/2031   Pneumonia Vaccine  Completed   Hepatitis C Screening  Completed   Zoster (Shingles) Vaccine  Completed   HPV Vaccine  Aged Out   Meningitis B Vaccine  Aged Out    Advanced directives: (Copy Requested) Please bring a copy of your health care power of attorney and living will to the office to be added to your chart at your convenience. You can mail to [name] [address] [place], [place] [address] or email to [email or web address] Advance Care Planning is important because it:  [x]  Makes sure you receive the medical care that is consistent with your values, goals, and preferences  [x]  It provides guidance to your family and loved ones and reduces their decisional  burden about whether or not they are making the right decisions based on your wishes.  Follow the link provided in your after visit summary or read over the paperwork we have mailed to you to help you started getting your Advance Directives in place. If you need assistance in completing these, please reach out to us  so that we can help you!  See attachments for Preventive Care and Fall Prevention Tips.

## 2023-08-03 NOTE — Progress Notes (Signed)
 Subjective:   Nathan Collins is a 73 y.o. who presents for a Medicare Wellness preventive visit.  As a reminder, Annual Wellness Visits don't include a physical exam, and some assessments may be limited, especially if this visit is performed virtually. We may recommend an in-person follow-up visit with your provider if needed.  Visit Complete: Virtual I connected with  [name] on 08/03/23 by a audio enabled telemedicine application and verified that I am speaking with the correct person using two identifiers.  Patient Location: Home  Provider Location: Home Office  I discussed the limitations of evaluation and management by telemedicine. The patient expressed understanding and agreed to proceed.  Vital Signs: Because this visit was a virtual/telehealth visit, some criteria may be missing or patient reported. Any vitals not documented were not able to be obtained and vitals that have been documented are patient reported.  VideoDeclined- This patient declined Librarian, academic. Therefore the visit was completed with audio only.  Persons Participating in Visit: Patient.  AWV Questionnaire: Yes: Patient Medicare AWV questionnaire was completed by the patient on 07/30/23; I have confirmed that all information answered by patient is correct and no changes since this date.  Cardiac Risk Factors include: advanced age (>43men, >43 women);hypertension;dyslipidemia;male gender;obesity (BMI >30kg/m2)     Objective:     Today's Vitals   08/03/23 0819  Weight: 222 lb (100.7 kg)  Height: 5' 7 (1.702 m)   Body mass index is 34.77 kg/m.     08/03/2023    8:23 AM 07/27/2022    8:54 AM 07/24/2021    8:56 AM 04/16/2019    9:33 AM 03/16/2018    9:18 AM 12/10/2011    4:30 PM 12/07/2011    9:37 AM  Advanced Directives  Does Patient Have a Medical Advance Directive? Yes Yes Yes Yes Yes Patient has advance directive, copy not in chart Patient has advance  directive, copy not in chart  Type of Advance Directive Healthcare Power of [place];Living will Healthcare Power of [place];Living will Healthcare Power of Attorney Living will;Healthcare Power of [employer] Power of [place];Living will Healthcare Power of [place];Living will  Does patient want to make changes to medical advance directive?    No - Patient declined     Copy of Healthcare Power of Attorney in Chart? No - copy requested No - copy requested No - copy requested No - copy requested     Pre-existing out of facility DNR order (yellow form or pink MOST form)      No No    Current Medications (verified) Outpatient Encounter Medications as of 08/03/2023  Medication Sig   allopurinol  (ZYLOPRIM ) 100 MG tablet Take 2 tablets (200 mg total) by mouth every morning.   atorvastatin  (LIPITOR) 20 MG tablet Take 1 tablet (20 mg total) by mouth daily.   Cholecalciferol (VITAMIN D3) 25 MCG (1000 UT) CAPS    Coenzyme Q10 (CO Q 10 PO) Take 300 mg by mouth daily.   COLCRYS  0.6 MG tablet TAKE 1 TABLET (0.6 MG TOTAL) BY MOUTH DAILY AS NEEDED (GOUT).   doxycycline (VIBRA-TABS) 100 MG tablet    ezetimibe  (ZETIA ) 10 MG tablet Take 1 tablet (10 mg total) by mouth daily.   Multiple Vitamin (MULTIVITAMIN) tablet Take 1 tablet by mouth daily.   valsartan  (DIOVAN ) 160 MG tablet Take 1 tablet (160 mg total) by mouth daily.   amoxicillin  (AMOXIL ) 500 MG capsule Take 4 capsules (2,000 mg total) by mouth as needed (  prior to dental procedures). (Patient not taking: Reported on 08/03/2023)   No facility-administered encounter medications on file as of 08/03/2023.    Allergies (verified) Indocin [indomethacin]   History: Past Medical History:  Diagnosis Date   Arthritis    Ascending aortic aneurysm (HCC) 08/05/2020   Cancer (HCC)    HX OF MALIGNANT MELANOMA SHOULDER    Coronary artery disease    Medical thearpy, 02/20/18 CT coronary/FFR   Heart murmur    History of kidney stones     A FEW YRS  AGO   Hypertension    Sleep apnea    CPAP- 4 GOES TO 12 OR 14    Thoracic ascending aortic aneurysm (HCC)    4 cm 02/20/18   Past Surgical History:  Procedure Laterality Date   APPENDECTOMY     JOINT REPLACEMENT     RIGHT KNEE REPLACEMENT    KNEE ARTHROSCOPY     LEFT    LUMBAR LAMINECTOMY/DECOMPRESSION MICRODISCECTOMY Bilateral 03/23/2018   Procedure: Lumbar Three-Four Lumbar Four-Five Laminectomy/Foraminotomy with sublaminar decompression;  Surgeon: Isadora Mar, MD;  Location: [facility] OR;  Service: Neurosurgery;  Laterality: Bilateral;  Lumbar Three-Four Lumbar Four-Five Laminectomy/Foraminotomy with sublaminar decompression   RIGHT KNEE SURGERY      TOTAL KNEE ARTHROPLASTY  12/10/2011   Procedure: TOTAL KNEE ARTHROPLASTY;  Surgeon: Genevie Kerns, MD;  Location: [facility] ORS;  Service: Orthopedics;  Laterality: Left;   VASECTOMY     Family History  Problem Relation Age of Onset   Diabetes Father        deceased 08/23/08   Prostate cancer Father        cabg    Heart failure Father    Bladder Cancer Mother        pacemaker August 24, 2010   Peripheral Artery Disease Mother    Arrhythmia Mother    Heart failure Mother    Hyperlipidemia Mother    Social History   Socioeconomic History   Marital status: Married    Spouse name: Not on file   Number of children: Not on file   Years of education: Not on file   Highest education level: Bachelor's degree (e.g., BA, AB, BS)  Occupational History    Comment: Sales Rep   Tobacco Use   Smoking status: Never   Smokeless tobacco: Never  Vaping Use   Vaping status: Never Used  Substance and Sexual Activity   Alcohol use: Not Currently   Drug use: No   Sexual activity: Yes    Partners: Female  Other Topics Concern   Not on file  Social History Narrative   Not on file   Social Drivers of Health   Financial Resource Strain: Low Risk  (08/03/2023)   Overall Financial Resource Strain (CARDIA)    Difficulty of Paying Living Expenses: Not hard at  all  Food Insecurity: No Food Insecurity (08/03/2023)   Hunger Vital Sign    Worried About Running Out of Food in the Last Year: Never true    Ran Out of Food in the Last Year: Never true  Transportation Needs: No Transportation Needs (08/03/2023)   PRAPARE - Administrator, Civil Service (Medical): No    Lack of Transportation (Non-Medical): No  Physical Activity: Insufficiently Active (08/03/2023)   Exercise Vital Sign    Days of Exercise per Week: 1 day    Minutes of Exercise per Session: 30 min  Stress: No Stress Concern Present (08/03/2023)   [employer] of Occupational Health - Occupational  Stress Questionnaire    Feeling of Stress : Not at all  Social Connections: Moderately Isolated (08/03/2023)   Social Connection and Isolation Panel [NHANES]    Frequency of Communication with Friends and Family: More than three times a week    Frequency of Social Gatherings with Friends and Family: More than three times a week    Attends Religious Services: Never    Database administrator or Organizations: No    Attends Engineer, structural: Never    Marital Status: Married    Tobacco Counseling Counseling given: Not Answered    Clinical Intake:  Pre-visit preparation completed: Yes  Pain : No/denies pain     BMI - recorded: 34.77 Nutritional Status: BMI > 30  Obese Diabetes: No  Lab Results  Component Value Date   HGBA1C 6.0 04/08/2023   HGBA1C 6.0 04/06/2022   HGBA1C 5.9 04/06/2021     How often do you need to have someone help you when you read instructions, pamphlets, or other written materials from your doctor or pharmacy?: 1 - Never  Interpreter Needed?: No  Information entered by :: Lamont Pilsner, LPN   Activities of Daily Living     07/30/2023   11:09 AM  In your present state of health, do you have any difficulty performing the following activities:  Hearing? 1  Comment hearing aids  Vision? 0  Difficulty concentrating or making  decisions? 0  Walking or climbing stairs? 0  Dressing or bathing? 0  Doing errands, shopping? 0  Preparing Food and eating ? N  Using the Toilet? N  In the past six months, have you accidently leaked urine? N  Do you have problems with loss of bowel control? N  Managing your Medications? N  Managing your Finances? N  Housekeeping or managing your Housekeeping? N    Patient Care Team: Rodney Clamp, MD as PCP - General (Family Medicine) Maudine Sos, MD as PCP - Cardiology (Cardiology) Ronn Cohn, MD as Consulting Physician (Orthopedic Surgery) Loleta Ripa, NP (Inactive) as Nurse Practitioner (Cardiology) Worthy Heads, MD as Consulting Physician (Ophthalmology) Joaquin Mulberry, MD as Consulting Physician (Neurosurgery) Alejos Amsterdam, MD (Inactive) as Consulting Physician (Sleep Medicine)  I have updated your Care Teams any recent Medical Services you may have received from other providers in the past year.     Assessment:    This is a routine wellness examination for [place].  Hearing/Vision screen Hearing Screening - Comments:: Hearing aids  Vision Screening - Comments:: Wears rx glasses - up to date with routine eye exams with Dr Alexia Idler and Dr Demetrios Finders     Goals Addressed             This Visit's Progress    Patient Stated       Lose weigh        Depression Screen     08/03/2023    8:24 AM 04/08/2023    8:32 AM 03/07/2023    8:30 AM 07/27/2022    8:53 AM 04/06/2022    8:37 AM 07/24/2021    8:55 AM 04/16/2019    9:34 AM  PHQ 2/9 Scores  PHQ - 2 Score 0 0 0 0 0 0 0    Fall Risk     07/30/2023   11:09 AM 04/08/2023    8:32 AM 03/07/2023    8:30 AM 07/26/2022    9:39 AM 04/06/2022    8:38 AM  Fall Risk   Falls  in the past year? 0 0 0 0 0  Number falls in past yr: 0 0 0 0 0  Injury with Fall? 0 0 0 0 0  Risk for fall due to : No Fall Risks No Fall Risks No Fall Risks Impaired vision No Fall Risks  Follow up Falls prevention  discussed   Falls prevention discussed     MEDICARE RISK AT HOME:  Medicare Risk at Home Any stairs in or around the home?: (Patient-Rptd) Yes If so, are there any without handrails?: (Patient-Rptd) No Home free of loose throw rugs in walkways, pet beds, electrical cords, etc?: (Patient-Rptd) Yes Adequate lighting in your home to reduce risk of falls?: (Patient-Rptd) Yes Life alert?: (Patient-Rptd) No Use of a cane, walker or w/c?: (Patient-Rptd) No Grab bars in the bathroom?: (Patient-Rptd) Yes Shower chair or bench in shower?: (Patient-Rptd) Yes Elevated toilet seat or a handicapped toilet?: (Patient-Rptd) Yes  TIMED UP AND GO:  Was the test performed?  No  Cognitive Function: 6CIT completed        08/03/2023    8:25 AM 07/27/2022    8:55 AM 07/24/2021    8:58 AM 04/16/2019    9:34 AM  6CIT Screen  What Year? 0 points 0 points 0 points 0 points  What month? 0 points 0 points 0 points 0 points  What time? 0 points 0 points 0 points 0 points  Count back from 20 0 points 0 points 0 points 0 points  Months in reverse 0 points 0 points 0 points 0 points  Repeat phrase 0 points 0 points 0 points 0 points  Total Score 0 points 0 points 0 points 0 points    Immunizations Immunization History  Administered Date(s) Administered   Fluad Quad(high Dose 65+) 12/12/2018, 01/16/2020, 02/10/2022   Influenza, High Dose Seasonal PF 02/27/2018   Influenza-Unspecified 01/11/2021, 02/10/2022   PFIZER(Purple Top)SARS-COV-2 Vaccination 03/15/2019, 04/05/2019, 11/29/2019, 01/31/2021   Pneumococcal Conjugate-13 11/10/2016   Pneumococcal Polysaccharide-23 11/10/2015   Tdap 04/26/2016   Zoster Recombinant(Shingrix) 04/23/2022, 08/03/2022   Zoster, Live 07/29/2010    Screening Tests Health Maintenance  Topic Date Due   COVID-19 Vaccine (5 - 2024-25 season) 10/24/2022   INFLUENZA VACCINE  09/23/2023   Medicare Annual Wellness (AWV)  08/02/2024   DTaP/Tdap/Td (2 - Td or Tdap) 04/27/2026    Colonoscopy  09/18/2031   Pneumonia Vaccine 85+ Years old  Completed   Hepatitis C Screening  Completed   Zoster Vaccines- Shingrix  Completed   HPV VACCINES  Aged Out   Meningococcal B Vaccine  Aged Out    Health Maintenance  Health Maintenance Due  Topic Date Due   COVID-19 Vaccine (5 - 2024-25 season) 10/24/2022   Health Maintenance Items Addressed: See Nurse Notes at the end of this note  Additional Screening:  Vision Screening: Recommended annual ophthalmology exams for early detection of glaucoma and other disorders of the eye. Would you like a referral to an eye doctor? No    Dental Screening: Recommended annual dental exams for proper oral hygiene  Community Resource Referral / Chronic Care Management: CRR required this visit?  No   CCM required this visit?  No   Plan:    I have personally reviewed and noted the following in the patient's chart:   Medical and social history Use of alcohol, tobacco or illicit drugs  Current medications and supplements including opioid prescriptions. Patient is not currently taking opioid prescriptions. Functional ability and status Nutritional status Physical activity Advanced directives  List of other physicians Hospitalizations, surgeries, and ER visits in previous 12 months Vitals Screenings to include cognitive, depression, and falls Referrals and appointments  In addition, I have reviewed and discussed with patient certain preventive protocols, quality metrics, and best practice recommendations. A written personalized care plan for preventive services as well as general preventive health recommendations were provided to patient.   Bruno Capri, LPN   05/31/8117   After Visit Summary: (MyChart) Due to this being a telephonic visit, the after visit summary with patients personalized plan was offered to patient via MyChart   Notes: Nothing significant to report at this time.

## 2023-09-05 DIAGNOSIS — H52209 Unspecified astigmatism, unspecified eye: Secondary | ICD-10-CM | POA: Diagnosis not present

## 2023-09-05 DIAGNOSIS — H2511 Age-related nuclear cataract, right eye: Secondary | ICD-10-CM | POA: Diagnosis not present

## 2023-09-06 DIAGNOSIS — H2512 Age-related nuclear cataract, left eye: Secondary | ICD-10-CM | POA: Diagnosis not present

## 2023-09-19 DIAGNOSIS — H2512 Age-related nuclear cataract, left eye: Secondary | ICD-10-CM | POA: Diagnosis not present

## 2023-09-19 DIAGNOSIS — H25012 Cortical age-related cataract, left eye: Secondary | ICD-10-CM | POA: Diagnosis not present

## 2023-09-19 DIAGNOSIS — H25042 Posterior subcapsular polar age-related cataract, left eye: Secondary | ICD-10-CM | POA: Diagnosis not present

## 2023-09-26 DIAGNOSIS — L719 Rosacea, unspecified: Secondary | ICD-10-CM | POA: Diagnosis not present

## 2023-09-26 DIAGNOSIS — L814 Other melanin hyperpigmentation: Secondary | ICD-10-CM | POA: Diagnosis not present

## 2023-09-26 DIAGNOSIS — L821 Other seborrheic keratosis: Secondary | ICD-10-CM | POA: Diagnosis not present

## 2023-09-26 DIAGNOSIS — L570 Actinic keratosis: Secondary | ICD-10-CM | POA: Diagnosis not present

## 2023-09-26 DIAGNOSIS — L739 Follicular disorder, unspecified: Secondary | ICD-10-CM | POA: Diagnosis not present

## 2023-09-26 DIAGNOSIS — L578 Other skin changes due to chronic exposure to nonionizing radiation: Secondary | ICD-10-CM | POA: Diagnosis not present

## 2023-09-26 DIAGNOSIS — Z85820 Personal history of malignant melanoma of skin: Secondary | ICD-10-CM | POA: Diagnosis not present

## 2023-10-28 ENCOUNTER — Other Ambulatory Visit: Payer: Self-pay | Admitting: Family Medicine

## 2023-11-24 DIAGNOSIS — D485 Neoplasm of uncertain behavior of skin: Secondary | ICD-10-CM | POA: Diagnosis not present

## 2023-11-24 DIAGNOSIS — L739 Follicular disorder, unspecified: Secondary | ICD-10-CM | POA: Diagnosis not present

## 2024-01-02 ENCOUNTER — Other Ambulatory Visit (HOSPITAL_BASED_OUTPATIENT_CLINIC_OR_DEPARTMENT_OTHER): Payer: Self-pay | Admitting: Family

## 2024-01-02 DIAGNOSIS — E785 Hyperlipidemia, unspecified: Secondary | ICD-10-CM

## 2024-01-02 DIAGNOSIS — I2510 Atherosclerotic heart disease of native coronary artery without angina pectoris: Secondary | ICD-10-CM

## 2024-01-28 ENCOUNTER — Other Ambulatory Visit (HOSPITAL_BASED_OUTPATIENT_CLINIC_OR_DEPARTMENT_OTHER): Payer: Self-pay | Admitting: Cardiovascular Disease

## 2024-01-28 ENCOUNTER — Other Ambulatory Visit: Payer: Self-pay | Admitting: Family Medicine

## 2024-01-28 ENCOUNTER — Other Ambulatory Visit (HOSPITAL_BASED_OUTPATIENT_CLINIC_OR_DEPARTMENT_OTHER): Payer: Self-pay | Admitting: Family

## 2024-01-28 DIAGNOSIS — I10 Essential (primary) hypertension: Secondary | ICD-10-CM

## 2024-01-28 DIAGNOSIS — I2510 Atherosclerotic heart disease of native coronary artery without angina pectoris: Secondary | ICD-10-CM

## 2024-01-28 DIAGNOSIS — E785 Hyperlipidemia, unspecified: Secondary | ICD-10-CM

## 2024-03-03 ENCOUNTER — Encounter (HOSPITAL_BASED_OUTPATIENT_CLINIC_OR_DEPARTMENT_OTHER): Payer: Self-pay | Admitting: Cardiovascular Disease

## 2024-03-03 DIAGNOSIS — E785 Hyperlipidemia, unspecified: Secondary | ICD-10-CM

## 2024-03-03 DIAGNOSIS — I2510 Atherosclerotic heart disease of native coronary artery without angina pectoris: Secondary | ICD-10-CM

## 2024-03-03 DIAGNOSIS — I10 Essential (primary) hypertension: Secondary | ICD-10-CM

## 2024-03-05 MED ORDER — EZETIMIBE 10 MG PO TABS: 10.0000 mg | ORAL_TABLET | Freq: Every day | ORAL | 0 refills | Status: AC

## 2024-03-05 MED ORDER — ATORVASTATIN CALCIUM 20 MG PO TABS: 20.0000 mg | ORAL_TABLET | Freq: Every day | ORAL | 0 refills | Status: AC

## 2024-03-05 MED ORDER — VALSARTAN 160 MG PO TABS: 160.0000 mg | ORAL_TABLET | Freq: Every day | ORAL | 0 refills | Status: AC

## 2024-03-05 NOTE — Addendum Note (Signed)
 Addended by: Vernella Niznik on: 03/05/2024 12:19 PM   Modules accepted: Orders

## 2024-03-07 ENCOUNTER — Telehealth (HOSPITAL_BASED_OUTPATIENT_CLINIC_OR_DEPARTMENT_OTHER): Payer: Self-pay | Admitting: *Deleted

## 2024-03-07 NOTE — Telephone Encounter (Signed)
 S/w pharmacists from [facility].  Pt has tried to pick up medications to soon per insurance. Pt can call at the end of the month to get the scripts refilled for # 90 day supply.  Sent pt mychart message with update.

## 2024-03-25 NOTE — Progress Notes (Unsigned)
 error

## 2024-03-27 ENCOUNTER — Ambulatory Visit (INDEPENDENT_AMBULATORY_CARE_PROVIDER_SITE_OTHER): Admitting: Family

## 2024-03-27 ENCOUNTER — Encounter (HOSPITAL_BASED_OUTPATIENT_CLINIC_OR_DEPARTMENT_OTHER): Payer: Self-pay | Admitting: Family

## 2024-03-27 VITALS — BP 130/60 | HR 75 | Ht 67.0 in | Wt 226.9 lb

## 2024-03-27 DIAGNOSIS — I2510 Atherosclerotic heart disease of native coronary artery without angina pectoris: Secondary | ICD-10-CM | POA: Diagnosis not present

## 2024-03-27 DIAGNOSIS — I10 Essential (primary) hypertension: Secondary | ICD-10-CM

## 2024-03-27 DIAGNOSIS — E785 Hyperlipidemia, unspecified: Secondary | ICD-10-CM

## 2024-03-27 DIAGNOSIS — I7121 Aneurysm of the ascending aorta, without rupture: Secondary | ICD-10-CM | POA: Diagnosis not present

## 2024-03-27 NOTE — Progress Notes (Signed)
 " Cardiology Office Note:  .   Date:  03/27/2024  ID:  Nathan Collins, DOB 1951/02/09, MRN [id number] PCP: Kennyth Collins HERO, MD  [facility] Providers Cardiologist:  Nathan Scarce, MD Cardiology APP:  Nathan Reche RAMAN, NP    History of Present Illness: .   Nathan Collins is a 74 y.o. male with history of arthritis, melanoma, CAD, hypertension, OSA on CPAP, gout, hyperlipidemia, thoracic ascending aortic aneurysm.  Previously followed by Nathan Jude, NP and has since established with Dr. Scarce.  Prior coronary CTA 11/2017 chronic calcium  score of 137 placing him the 53rd percentile with ascending aortic aneurysm 40 mm.  FFR was mildly abnormal in the distal OM1.  Follow-up chest CT 02/2020 with stable aneurysm.  Seen 08/06/2021 and 08/2022 stable from cardiac perspective.  Echocardiogram 08/01/2023 for monitoring of ascending aorta with normal LVEF 55 to 60%, mild LVH, RV mildly enlarged, trivial AI/MR, ascending aorta 44 mm.  Recommended for CT aorta 07/2024.  Presents today for follow up independently. He has been feeling well, denying any chest pain or cardiac symptoms. With the recent snow, he shoveled his driveway successfully.   [name] just got a new home BP cuff. He did not bring it with him today but is consistently seeing 130-140s systolic readings, but only checking periodically. Today in the office he is 134/72.   Reports no shortness of breath nor dyspnea on exertion. Reports no chest pain, pressure, or tightness. No edema, orthopnea, PND. Reports no palpitations.   ROS: Please see the history of present illness.    All other systems reviewed and are negative.   Studies Reviewed: SABRA   EKG Interpretation Date/Time:  Tuesday March 27 2024 08:05:45 EST Ventricular Rate:  75 PR Interval:  178 QRS Duration:  90 QT Interval:  360 QTC Calculation: 402 R Axis:   -16  Text Interpretation: Normal sinus rhythm Normal ECG  V4 - lead not attached - uninterpretable  Confirmed by Nathan Reche ([id number]) on 03/27/2024 8:12:02 AM      Risk Assessment/Calculations:           Physical Exam:   VS:  BP 134/72 (BP Location: Left Arm, Patient Position: Sitting, Cuff Size: Large)   Pulse 75   Ht 5' 7 (1.702 m)   Wt 226 lb 14.4 oz (102.9 kg)   SpO2 94%   BMI 35.54 kg/m    Wt Readings from Last 3 Encounters:  03/27/24 226 lb 14.4 oz (102.9 kg)  08/03/23 222 lb (100.7 kg)  04/08/23 222 lb 9.6 oz (101 kg)    Vitals:   03/27/24 0802 03/27/24 0847  BP: 134/72 130/60  Pulse: 75   Height: 5' 7 (1.702 m)   Weight: 226 lb 14.4 oz (102.9 kg)   SpO2: 94%   BMI (Calculated): 35.53     GEN: Well nourished, well developed in no acute distress NECK: No JVD; No carotid bruits CARDIAC: RRR, no murmurs, rubs, gallops RESPIRATORY:  Clear to auscultation without rales, wheezing or rhonchi  ABDOMEN: Soft, non-tender, non-distended EXTREMITIES:  No edema; No deformity   ASSESSMENT AND PLAN: .    CAD / HLD, LDL goal <70 - Stable with no anginal symptoms. Labs 04/08/2023 with LDL 79, total 139. Due for repeat with PCP 2/17. Will defer for primary to obtain labs.  Recommend aiming for 150 minutes of moderate intensity activity per week and following a heart healthy diet.  Continue atorvastatin  20 mg daily and zetia  10 mg daily. If  LDL >70 on upcoming labs with PCP consider increasing atorvastatin  to 40mg  daily.   Ascending aortic aneurysm - Stable 40mm by CT 2019 and 2022.  Echo 07/2023 at 44 mm.  Recommended for CT chest aorta 07/2024.  Continue optimal BP control <130, BP control as below.   HTN - New home BP cuff with periodic readings consistently >130s. This is a new cuff, not verified for accuracy. Office reading 134/72. Repeat 130/66. Discussed BP goal <130. He would opt to check more regularly at home before increasing valsartan  dose at this time. He has primary care appointment 04/10/24.  BP log given to record more consistent home readings  Bring new BP cuff to  next appointment with primary to check for accuracy Discuss with primary care or send in readings. Would recommend increasing valsartan  dose to 320 mg daily if he is consistently >130.     Dispo: follow up in 6 months or sooner if necessary  Signed, Reche GORMAN Finder, NP   "

## 2024-04-10 ENCOUNTER — Ambulatory Visit: Payer: Medicare Other | Admitting: Family Medicine

## 2024-07-25 ENCOUNTER — Other Ambulatory Visit (HOSPITAL_BASED_OUTPATIENT_CLINIC_OR_DEPARTMENT_OTHER)
